# Patient Record
Sex: Female | Born: 1970 | Race: White | Hispanic: No
Health system: Midwestern US, Academic
[De-identification: ages and names within clinical notes are randomized; demographics above are authoritative.]

---

## 2013-11-01 IMAGING — CR PELVIS
5 series · 5 of 5 positions shown · non-contrast
Comparison: none

EXAM:  FIVE VIEWS LUMBAR SPINE
HISTORY: Low back pain.  Right radiculopathy.

[l-spine ap]
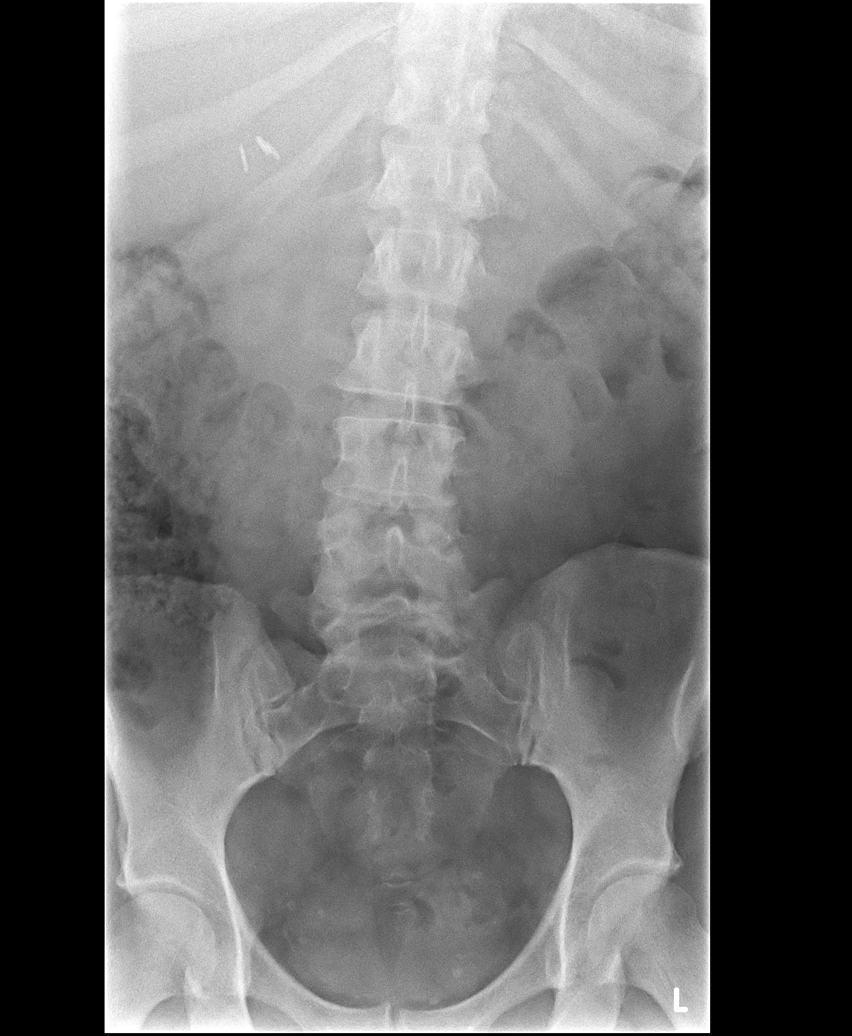

[l-spine obl (1 of 2)]
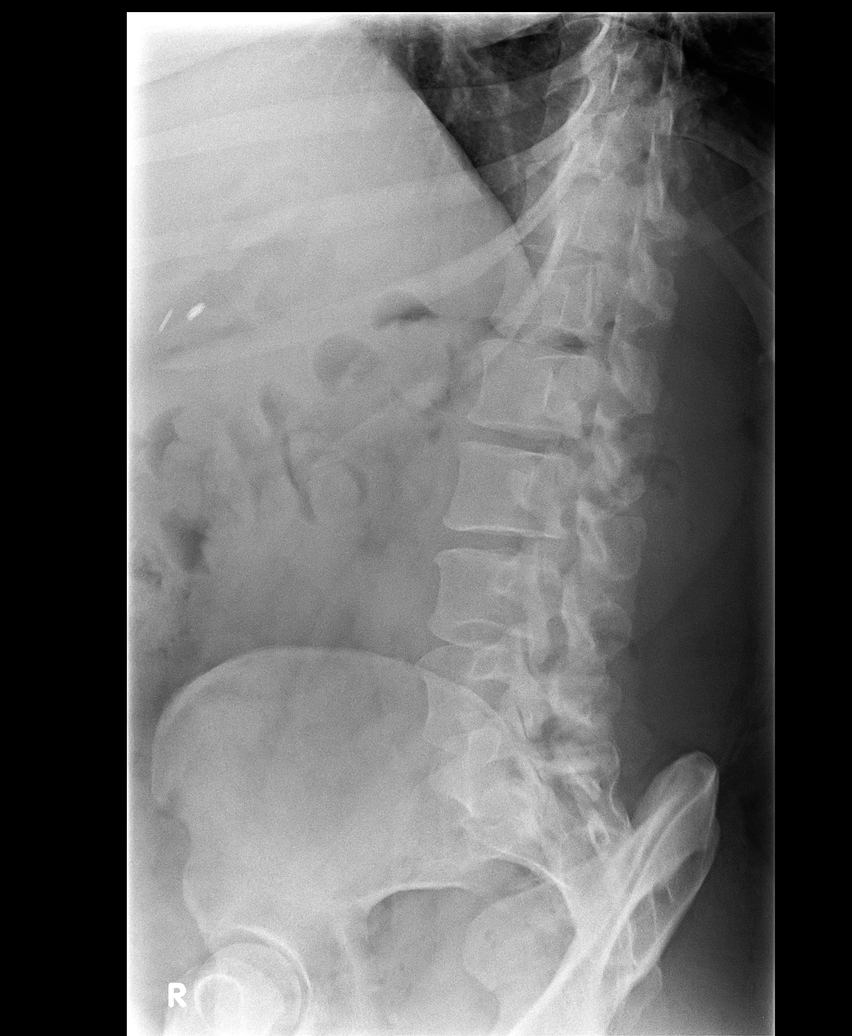

[l-spine obl (2 of 2)]
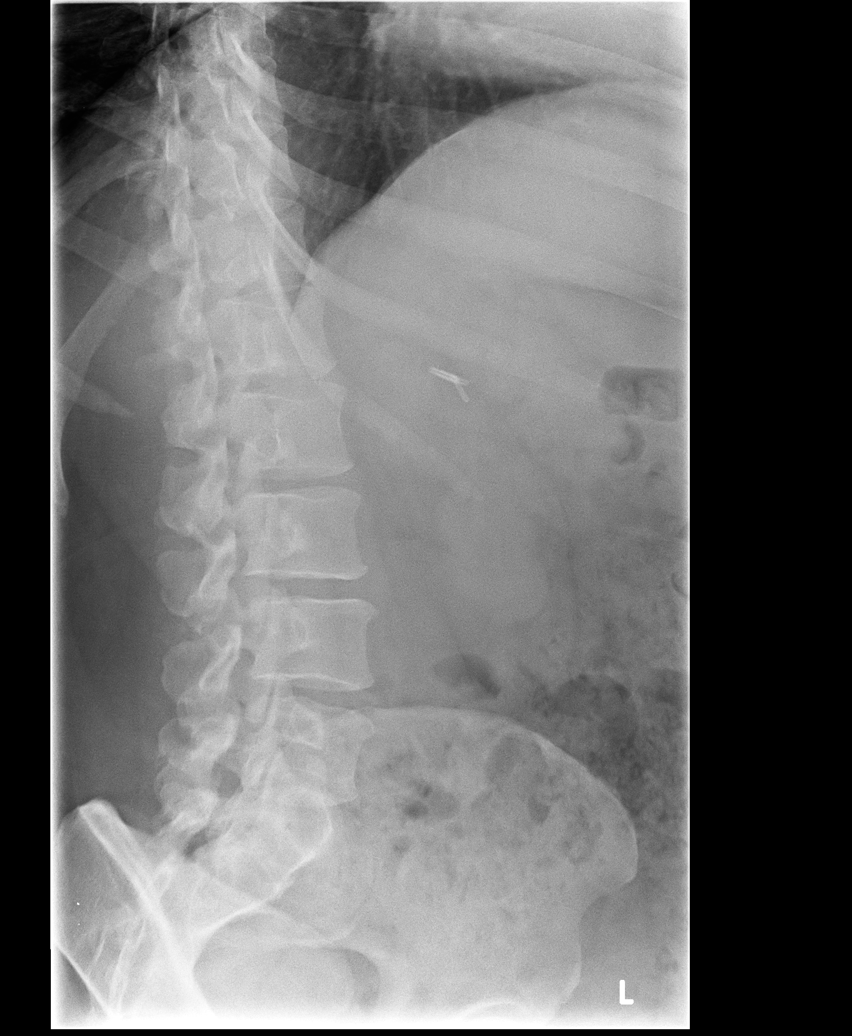

[l-spine lat]
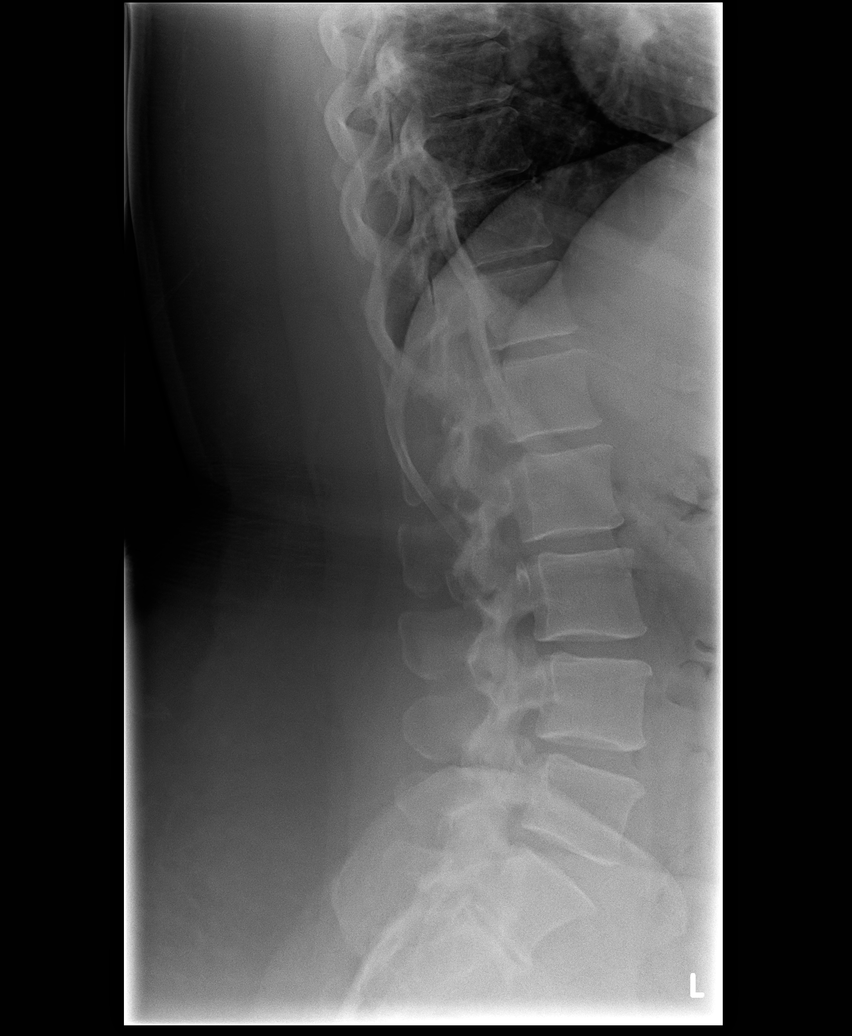

[l-spine l5-s1]
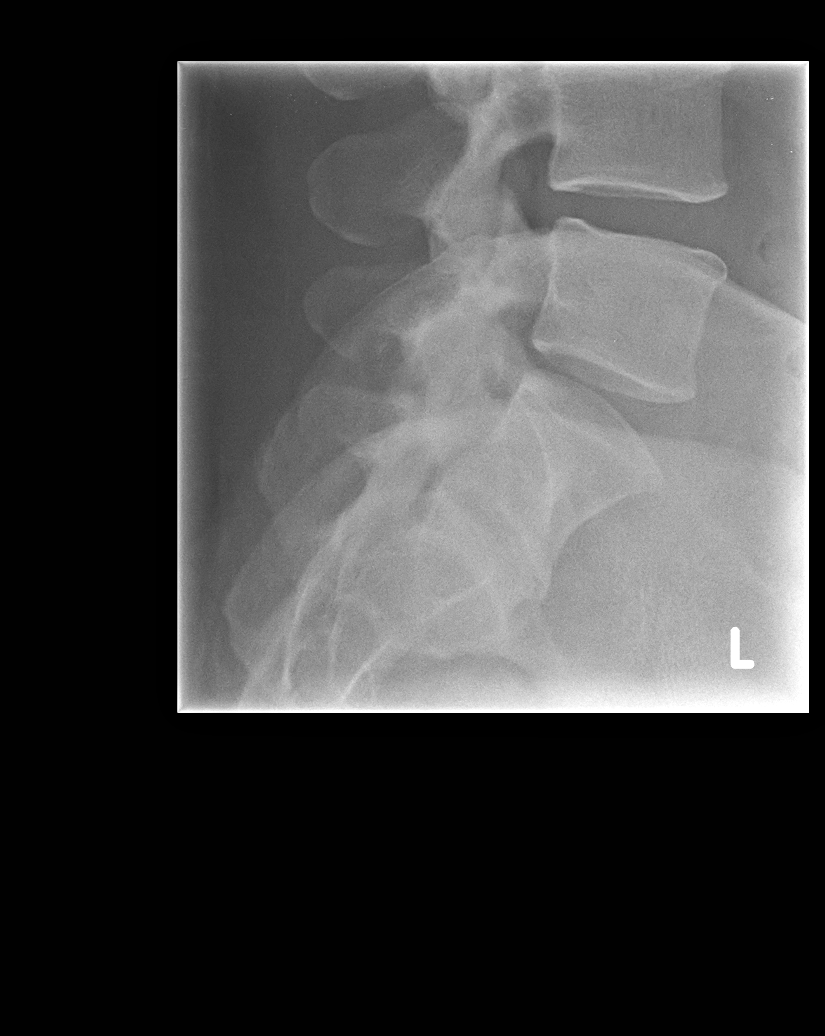

[5 of 5 positions shown; findings below may reference images not displayed]

IMPRESSION: Degenerative facet changes.  These appear advanced for age and not
particularly subtle for evident habitus.
FINDINGS: Five views of the lumbar spine show good preservation of vertebral body
heights.  Intervertebral disc heights appear well preserved.
Degenerative facet changes noted at 2-3, 3-4, 4-5 and 5-1.
Mild narrowing of the 5-1 foramen secondary to facet hypertrophy.

Tech Notes: LOW BACK PAIN, RADIATION DOWN RT LEG. RG

## 2013-11-04 IMAGING — CR PELVIS
3 series · 3 of 3 positions shown · non-contrast
Comparison: None

EXAM: Bilateral hips
INDICATION: Right hip pain
TECHNIQUE: AP pelvis, lateral left, lateral right hip

[pelvis]
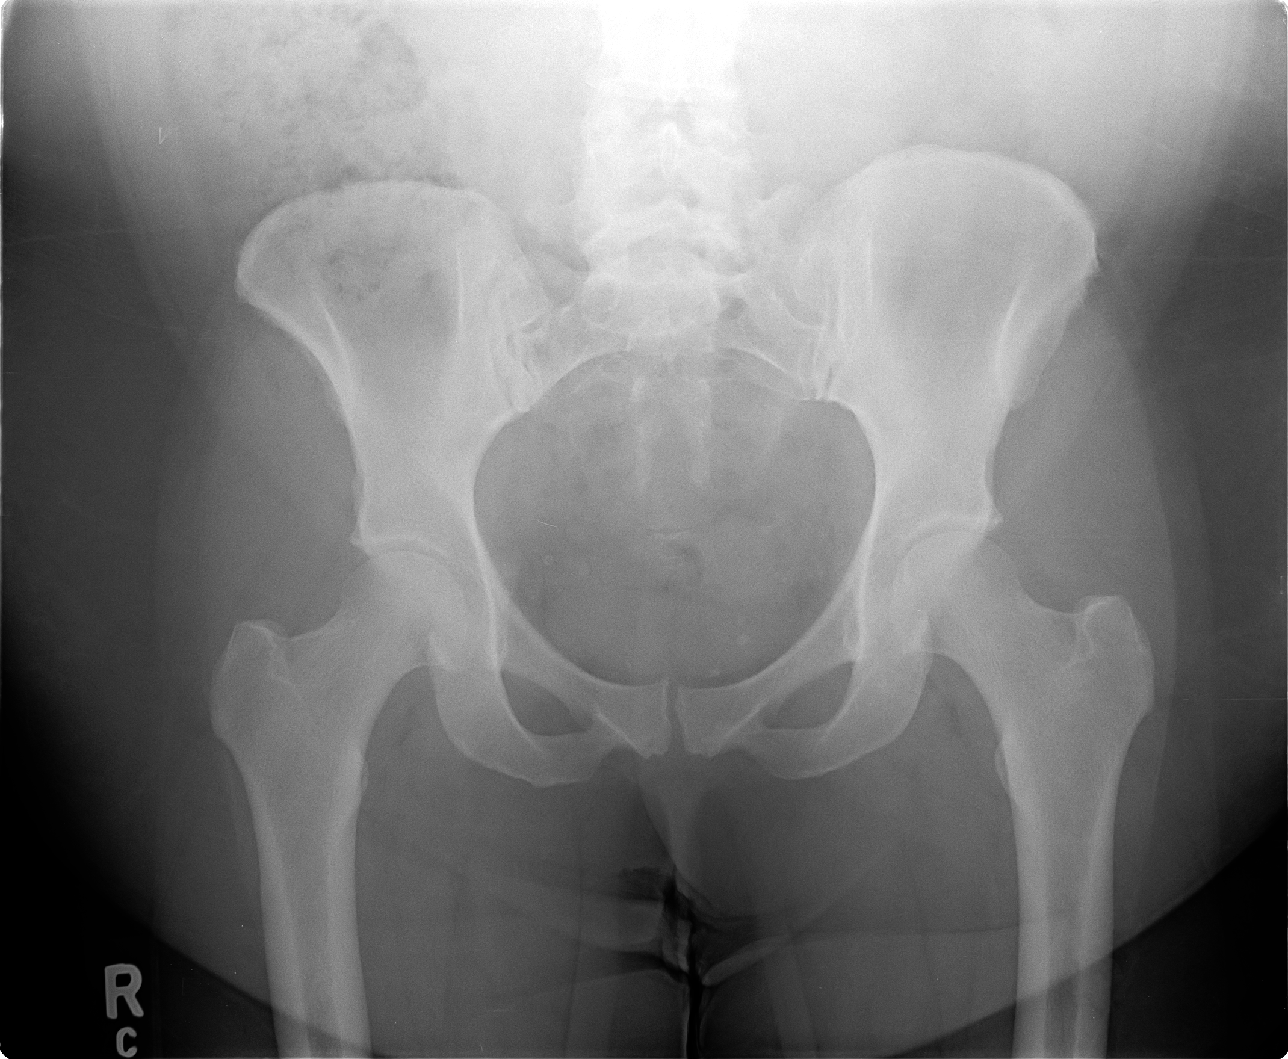

[hip frog (1 of 2)]
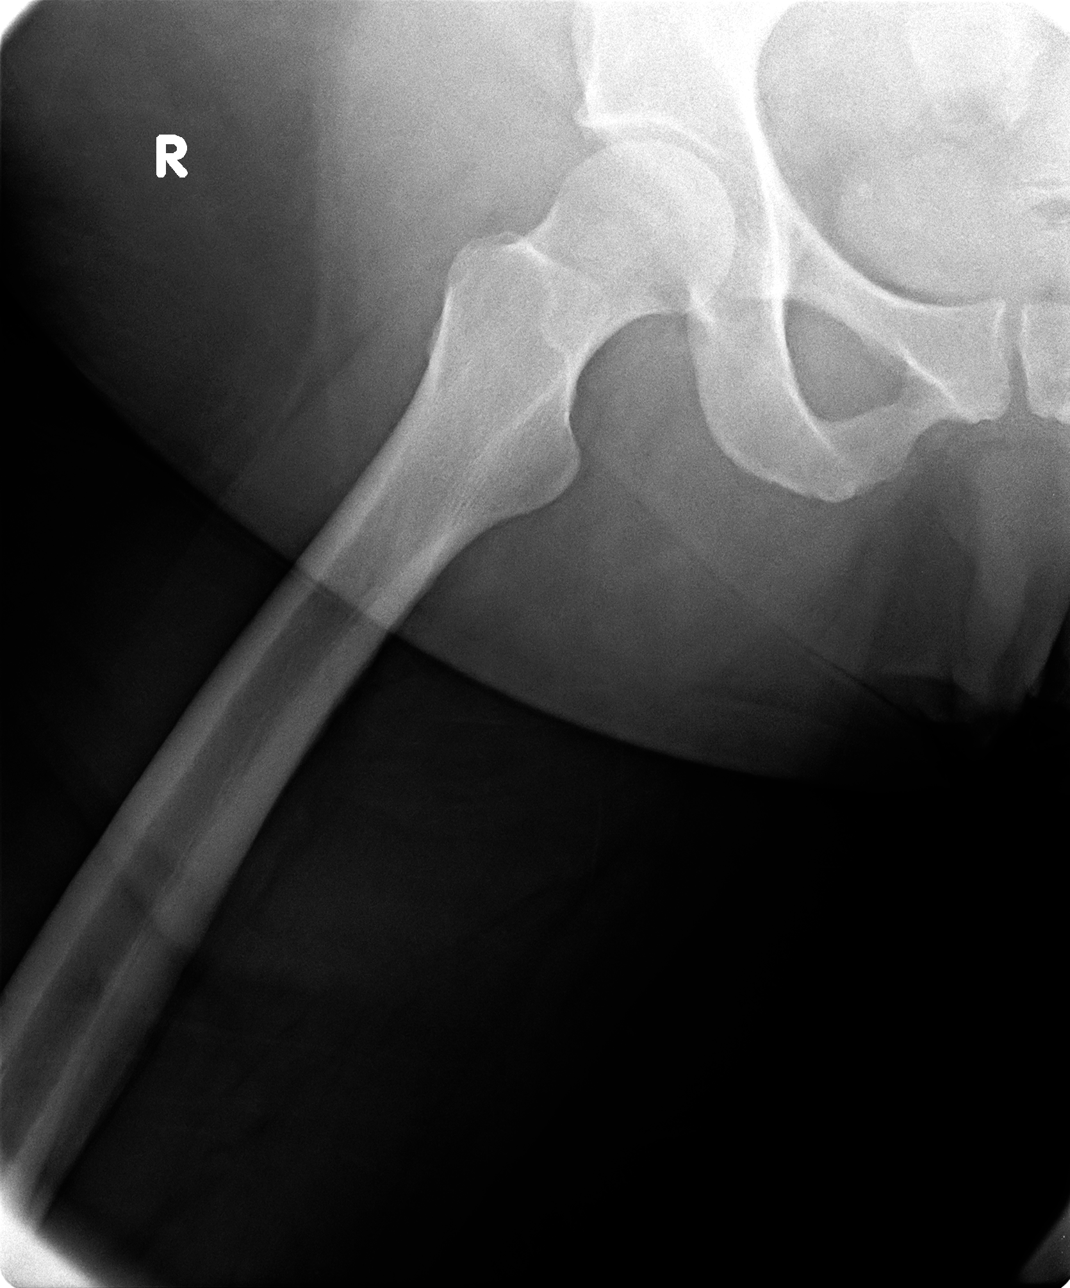

[hip frog (2 of 2)]
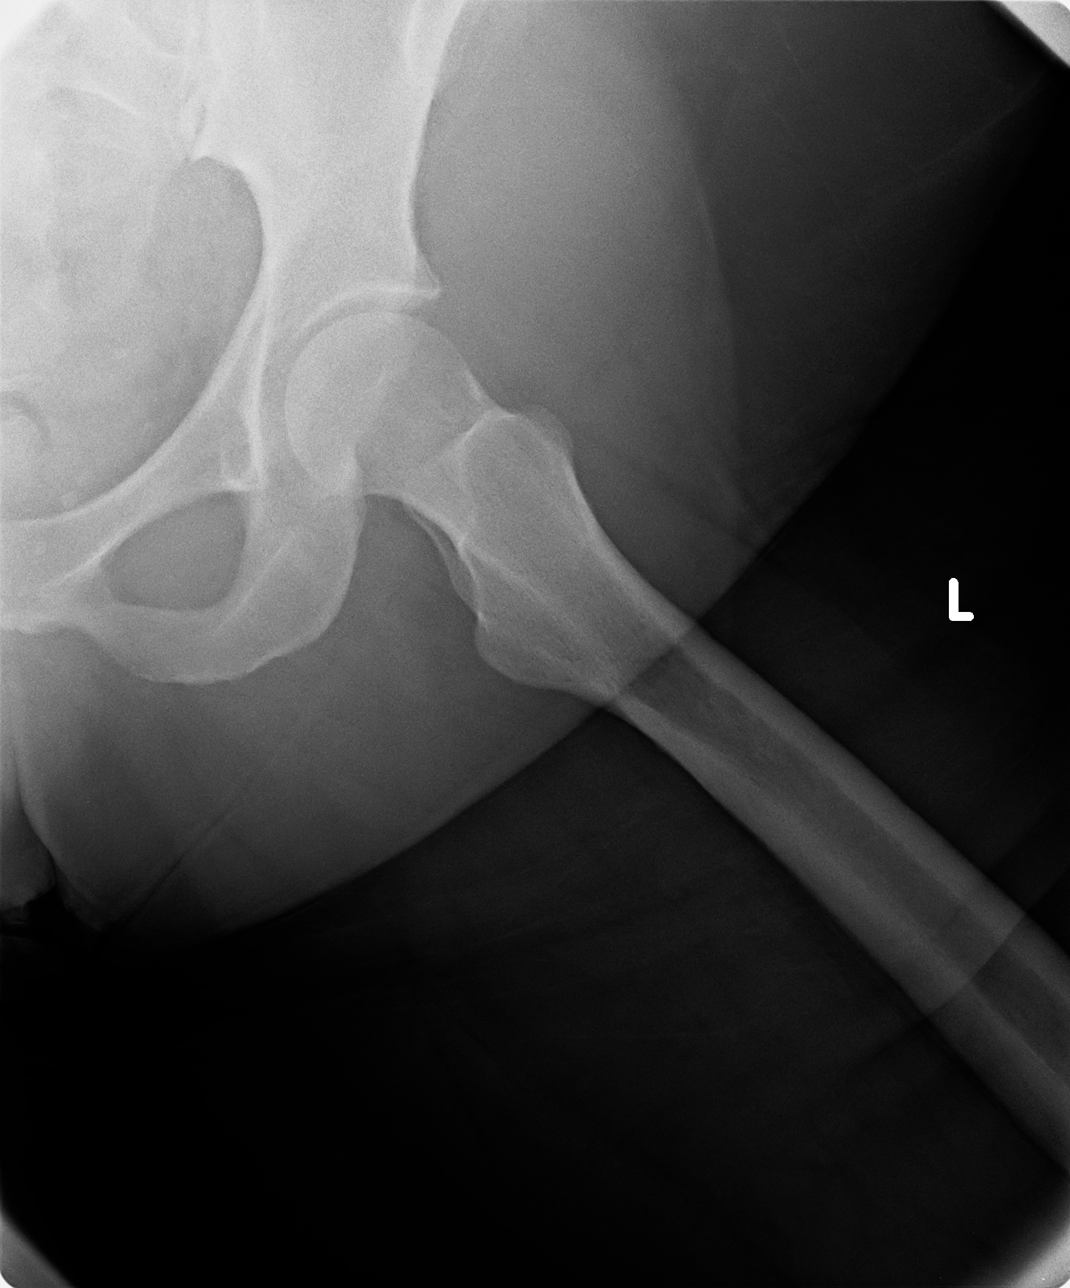

[3 of 3 positions shown; findings below may reference images not displayed]

IMPRESSION: Mild degenerative change of the bilateral hips without acute bony abnormality.
FINDINGS: There is normal rounded contour is of the femoral heads.  Mild degenerative
change within the acetabulum.  No fracture, no localized bony lesions.  Soft
tissues unremarkable.

Dictated by Sanford, Mbekou
Preliminary report until reviewed and verified by Alder, Eu

Tech Notes: PAIN. NO INJURY.MIKAILA

## 2014-11-08 IMAGING — CR LOW_EXM
2 series · 2 of 2 positions shown · non-contrast
Comparison: None available

EXAM: Left foot
INDICATION: Pain first metatarsal
TECHNIQUE: AP and lateral projections.

[foot]
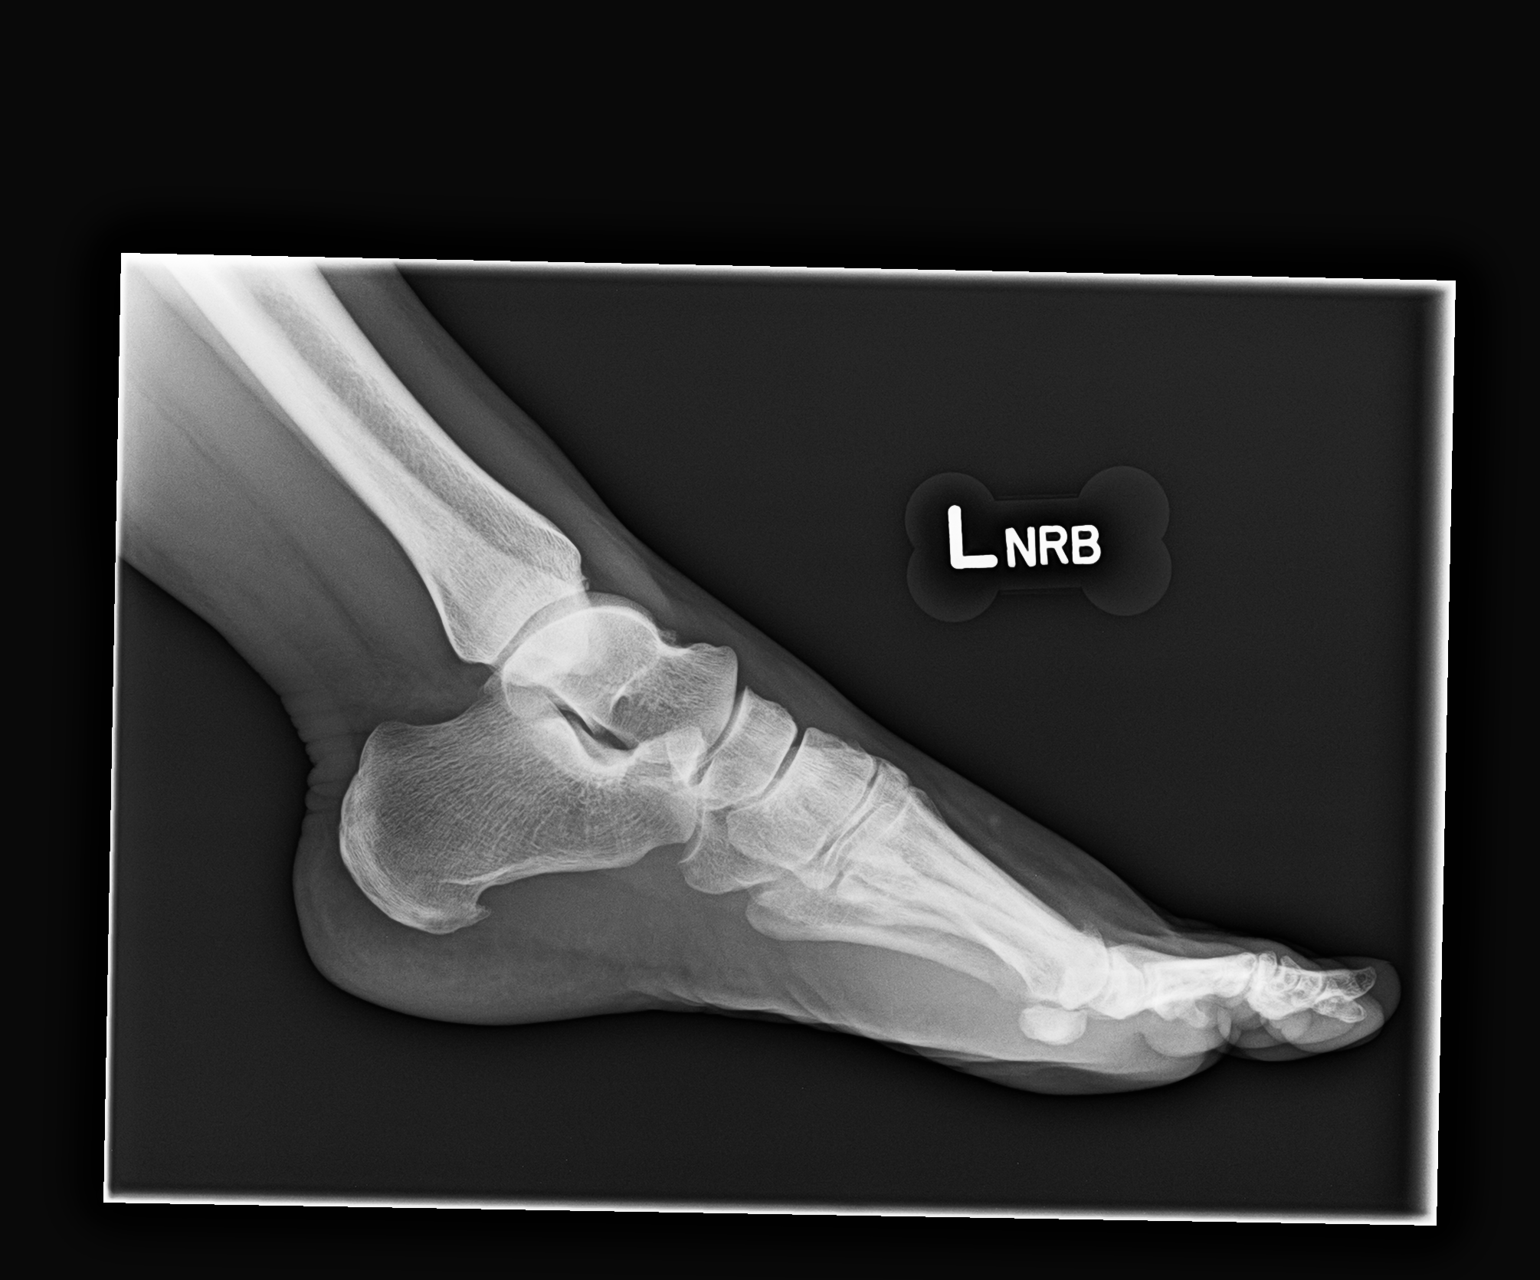

[foot lat]
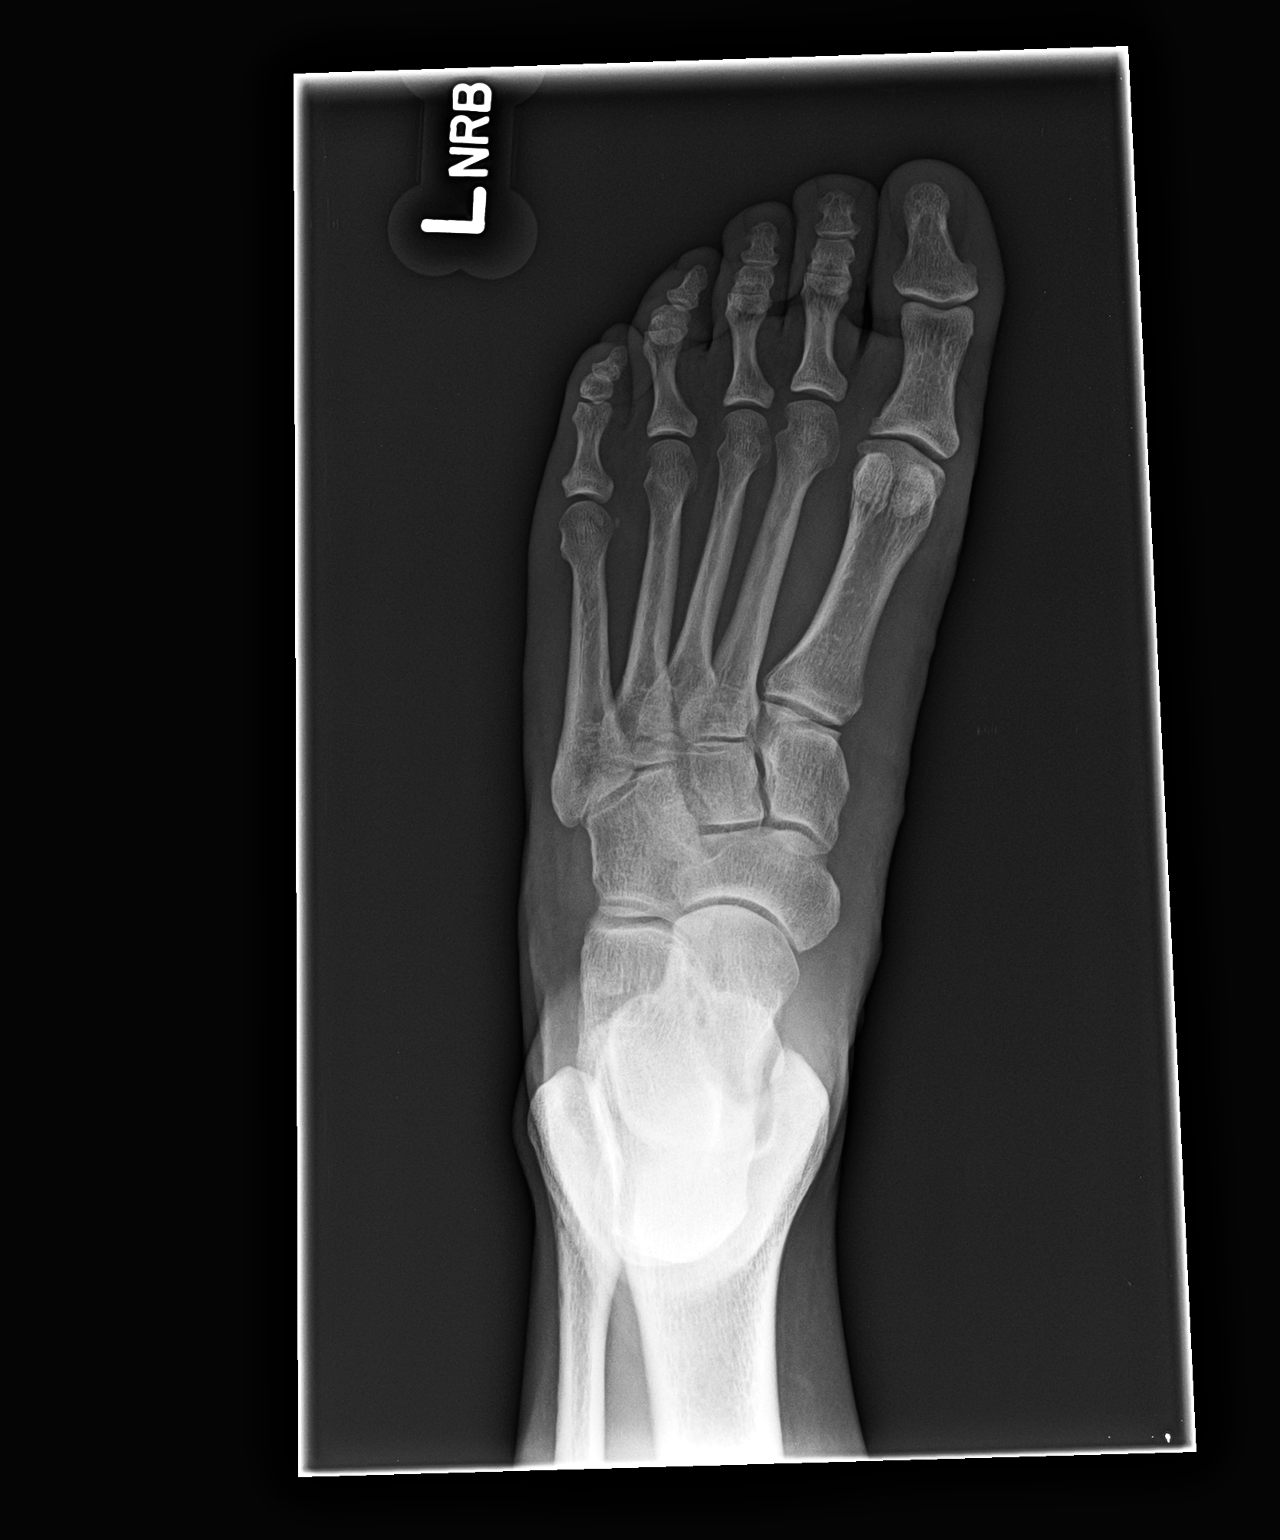

[2 of 2 positions shown; findings below may reference images not displayed]

IMPRESSION: Mild degenerative changes.
No acute bony process.
If there are continued complaints serial clinical and as indicated imaging
follow up is recommended
FINDINGS: There is no fracture, dislocation or destructive process of bone.
There is a small plantar spur
There are mild intertarsal degenerative changes.
There is mild joint space narrowing and spurring of the lateral aspect of the
first metatarsal-phalangeal joint.
There is slight soft tissue thickening along the extensor aspect of the
distal foot

Tech Notes: NO KNOWN INJURY. PAIN STARTED A COUPLE WEEKS AGO ON LEFT 1ST METATARSAL.

## 2014-12-03 IMAGING — US US RETROPERITONEAL LIMITED
1 series · 14 of 25 positions shown · non-contrast
Comparison: none

ULTRASOUND REPORT

US  RENAL
INDICATION: Hematuria
COMPARISION:CT abdomen pelvis November 06, 2013
TECHNIQUE: Real time transabdominal grayscale images plus color duplex.

[Series 1: us retroperitoneal limited · 14 of 34 slices shown]
[im 1/34]
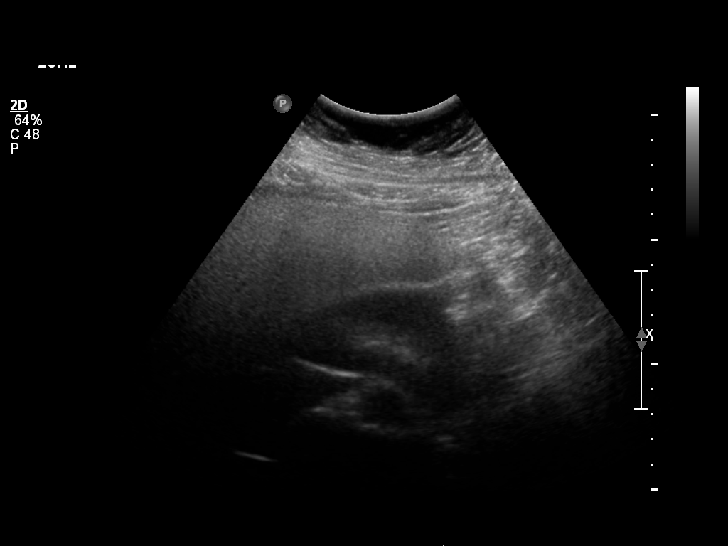
[im 3/34]
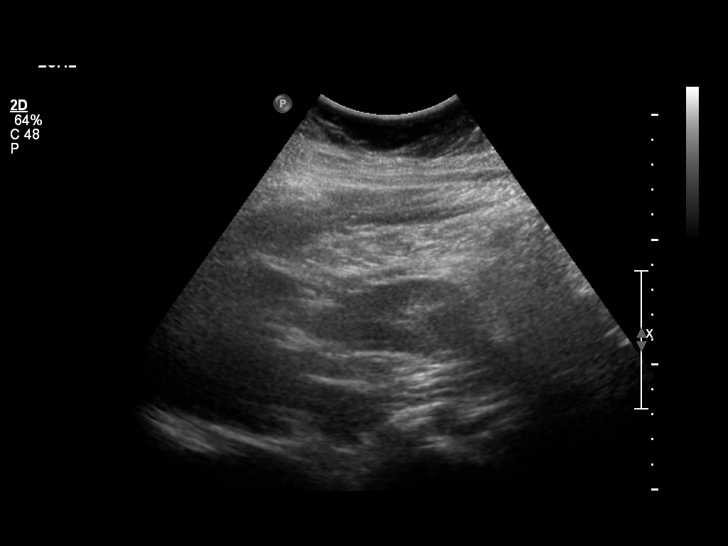
[im 6/34]
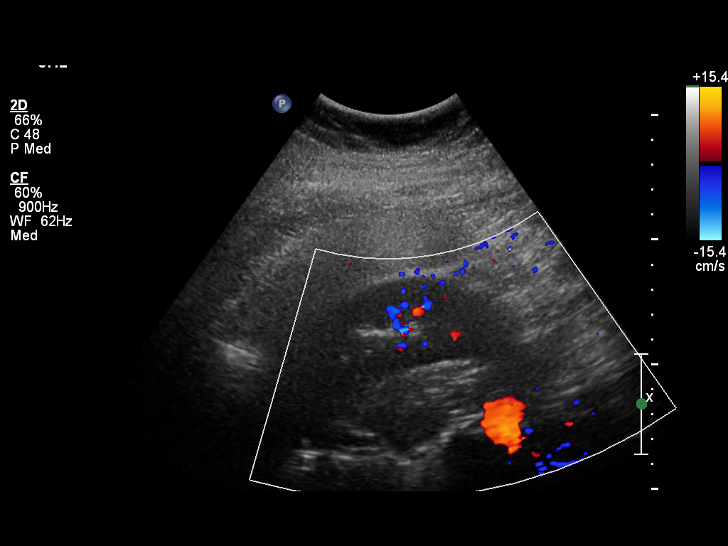
[im 9/34]
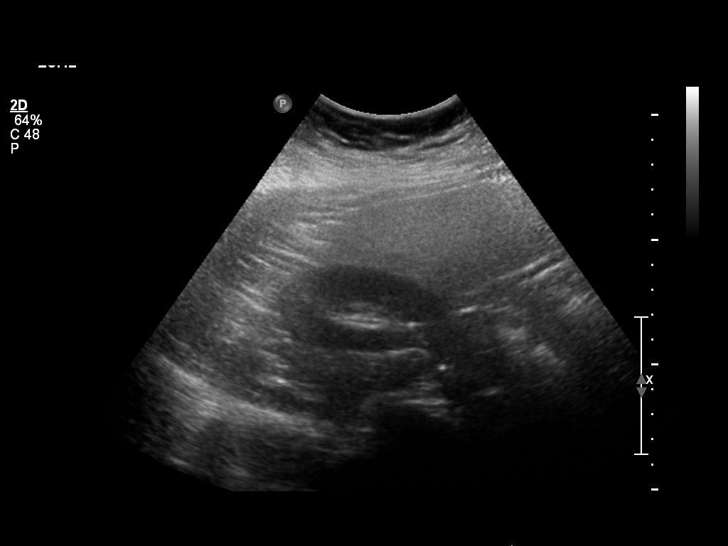
[im 12/34]
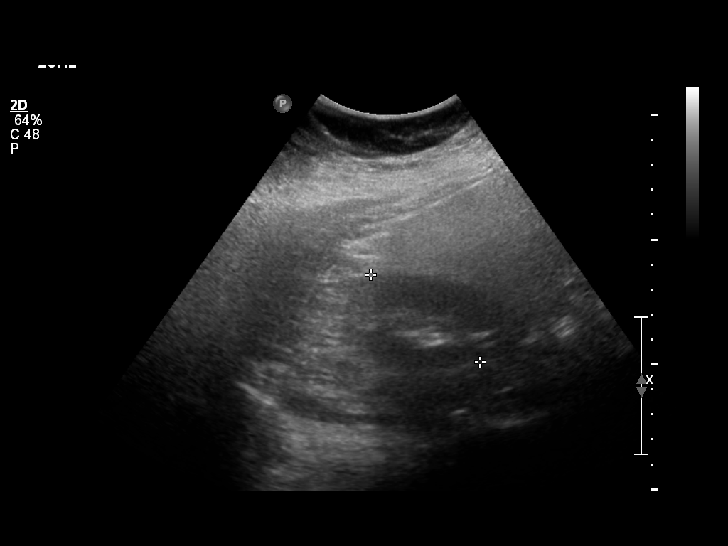
[im 13/34]
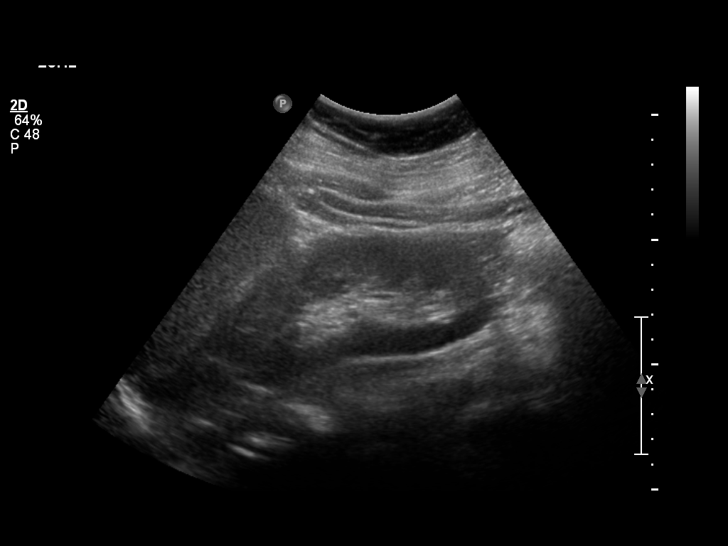
[im 16/34]
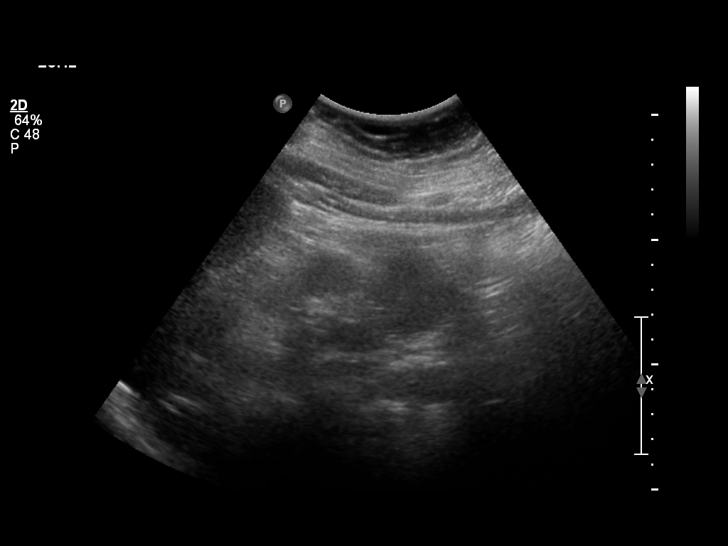
[im 18/34]
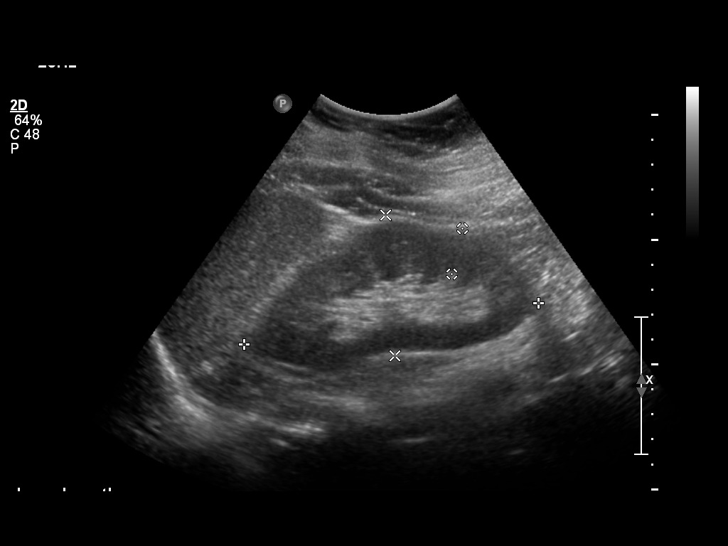
[im 21/34]
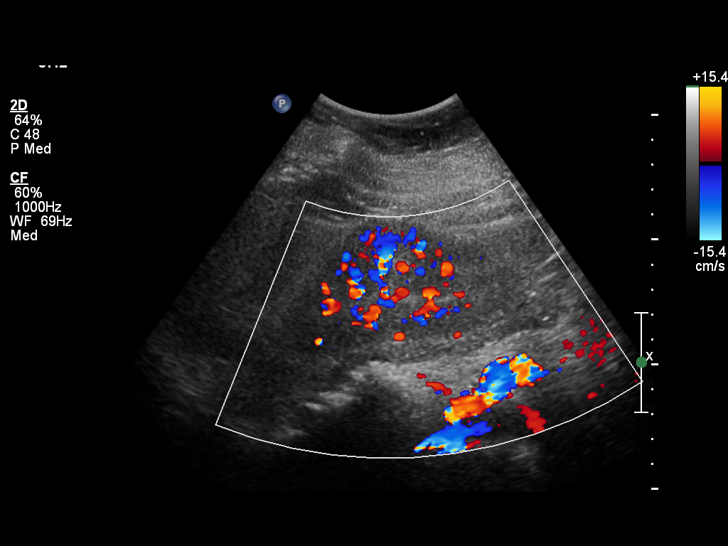
[im 23/34]
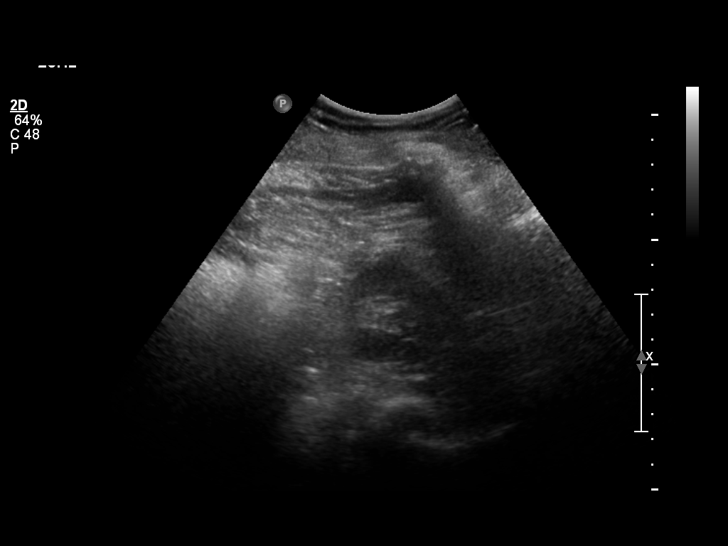
[im 25/34]
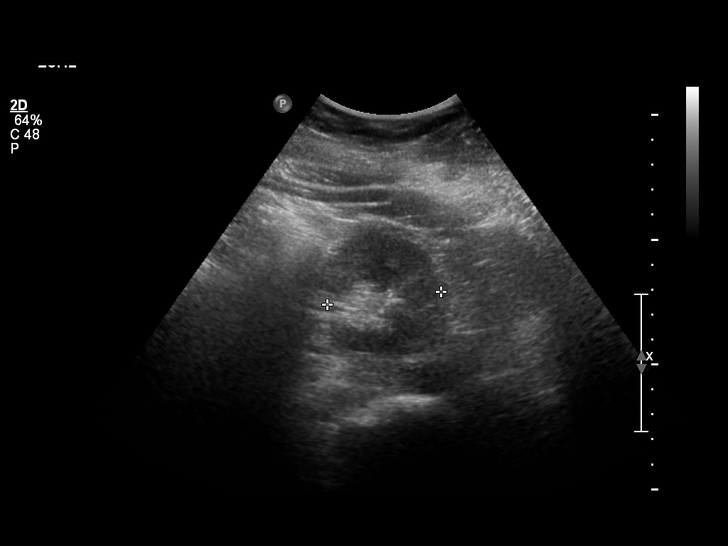
[im 28/34]
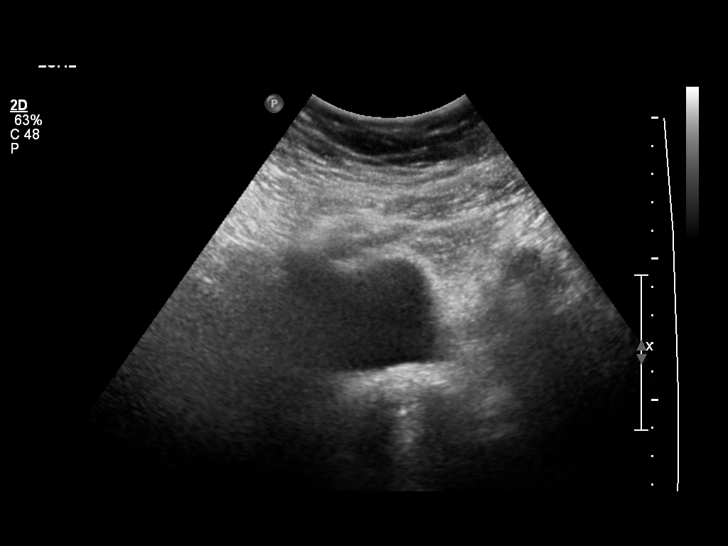
[im 31/34]
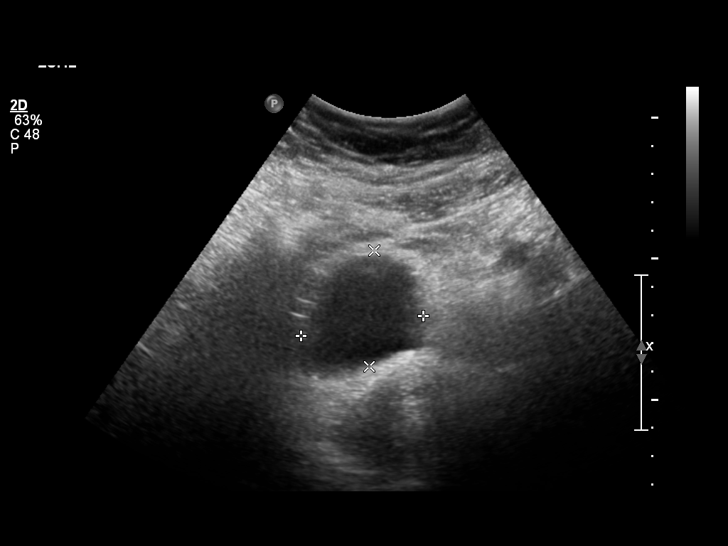
[im 34/34]
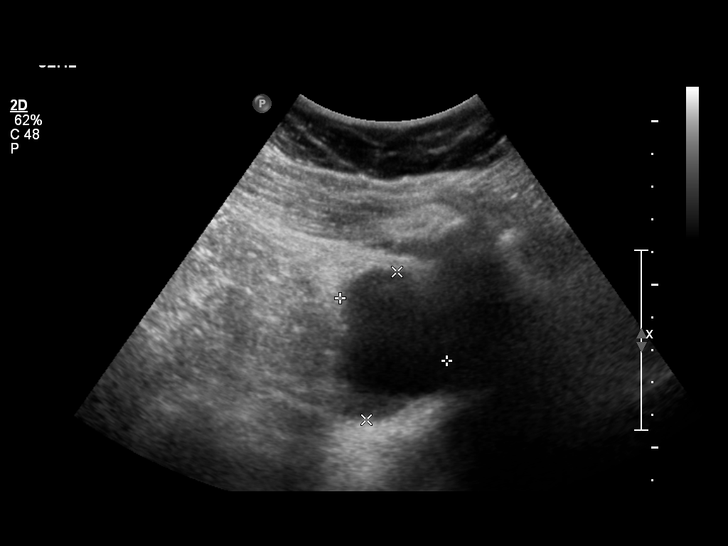

[14 of 25 positions shown; findings below may reference images not displayed]

IMPRESSION: Reduced size of the right kidney
Otherwise physiologic appearance of the kidneys.
FINDINGS: The right kidney measures 86.8 x 56 x 41.6 mm in size.
There is no hydronephrosis
There is no  focal abnormality
There cortical thickness is 12 mm
The cortex is hypoechoic to the liver
There is satisfactory color arborization.

The left kidney measures 118.8 x 56.4 x 45.7  mm in size.
There is no hydronephrosis.
There is no  focal abnormality
There cortical thickness is  mm
The cortex is hypoechoic to the liver
There is satisfactory color arborization

The bladder volume is 39.9 ml.
Bilateral urine jets are seen

## 2015-06-24 IMAGING — CR LOW_EXM
2 series · 2 of 2 positions shown · non-contrast
Comparison: none

[foot]
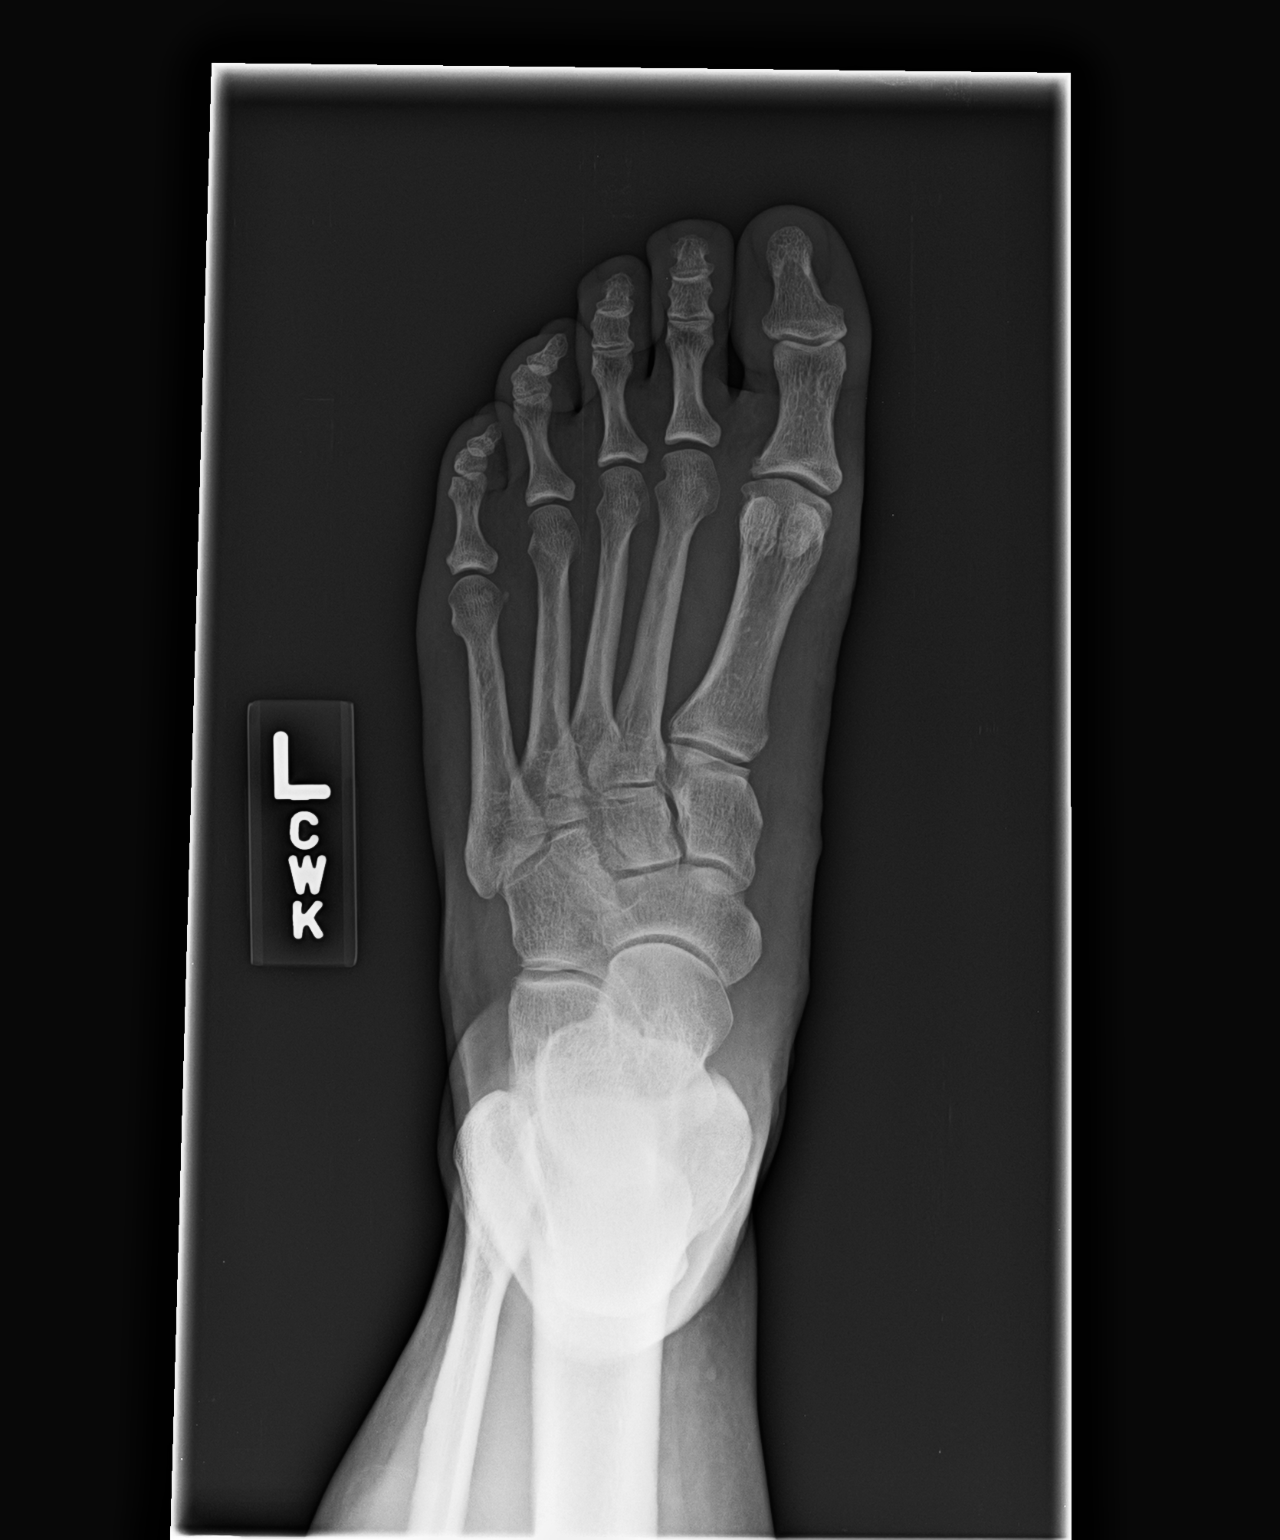

[foot lat]
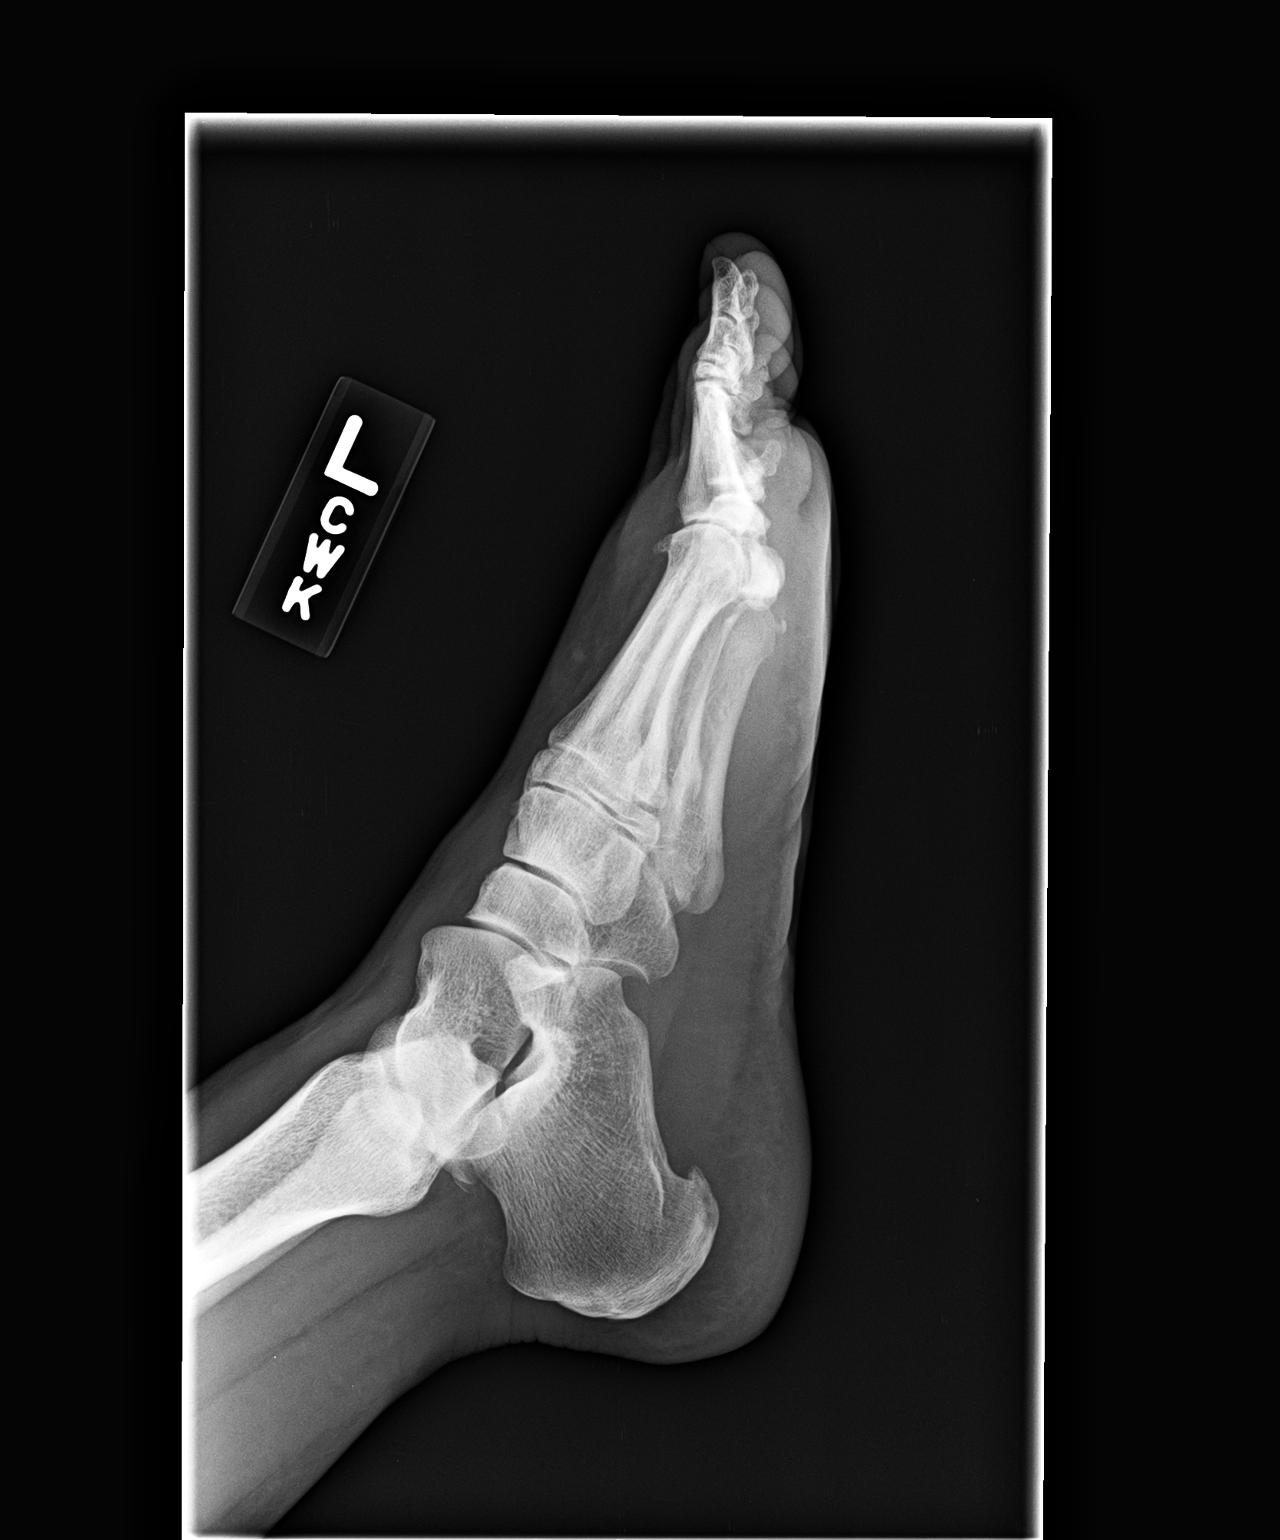

[2 of 2 positions shown; findings below may reference images not displayed]

EXAM
Two views left foot]

INDICATION
Left 1st MP joint pain/edema
PT. C/O PAIN AT FIRST MTP JOINT. ON THE DORSAL SIDE OF FOOT. CK

TECHNIQUE
AP, lateral, views were obtained

COMPARISONS
None available.

FINDINGS
There is a small heel spur. There is mild metatarsal-phalangeal joint degenerative change of the
1st digit with minimal hammertoe deformities of the 4th and 5th digits. There is no acute fracture,
dislocation, or other acute osseus abnormality. The soft tissues are unremarkable.

IMPRESSION
1. [Mild 1st MTP degenerative change with loss of joint space and marginal osteophyte formation.
No acute osseous abnormality.

## 2015-07-14 IMAGING — CR LOW_EXM
3 series · 3 of 3 positions shown · non-contrast
Comparison: none

[foot]
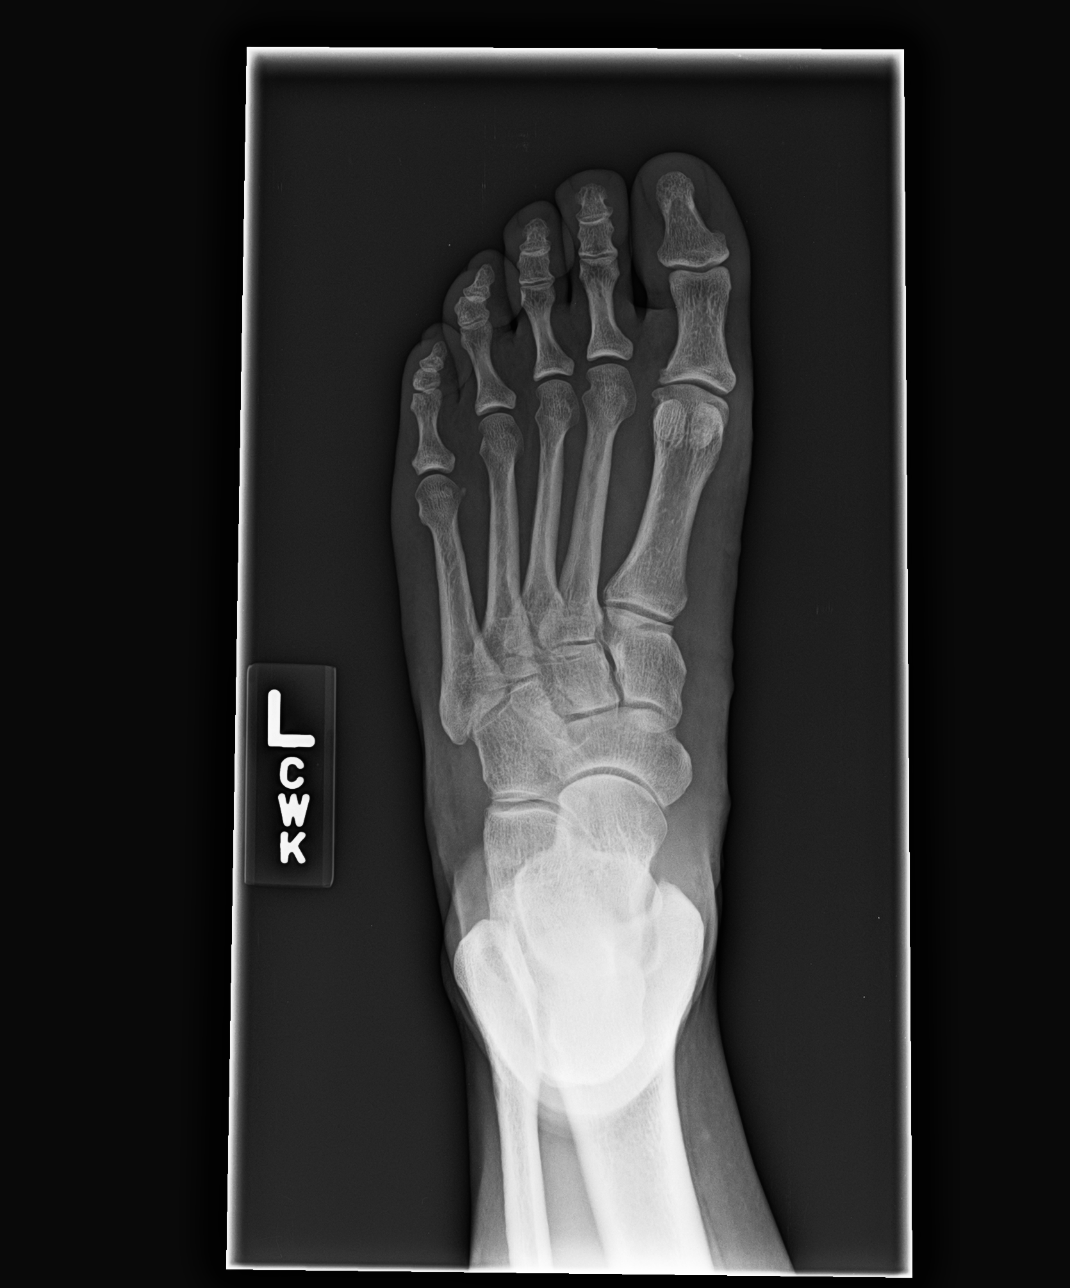

[foot obl]
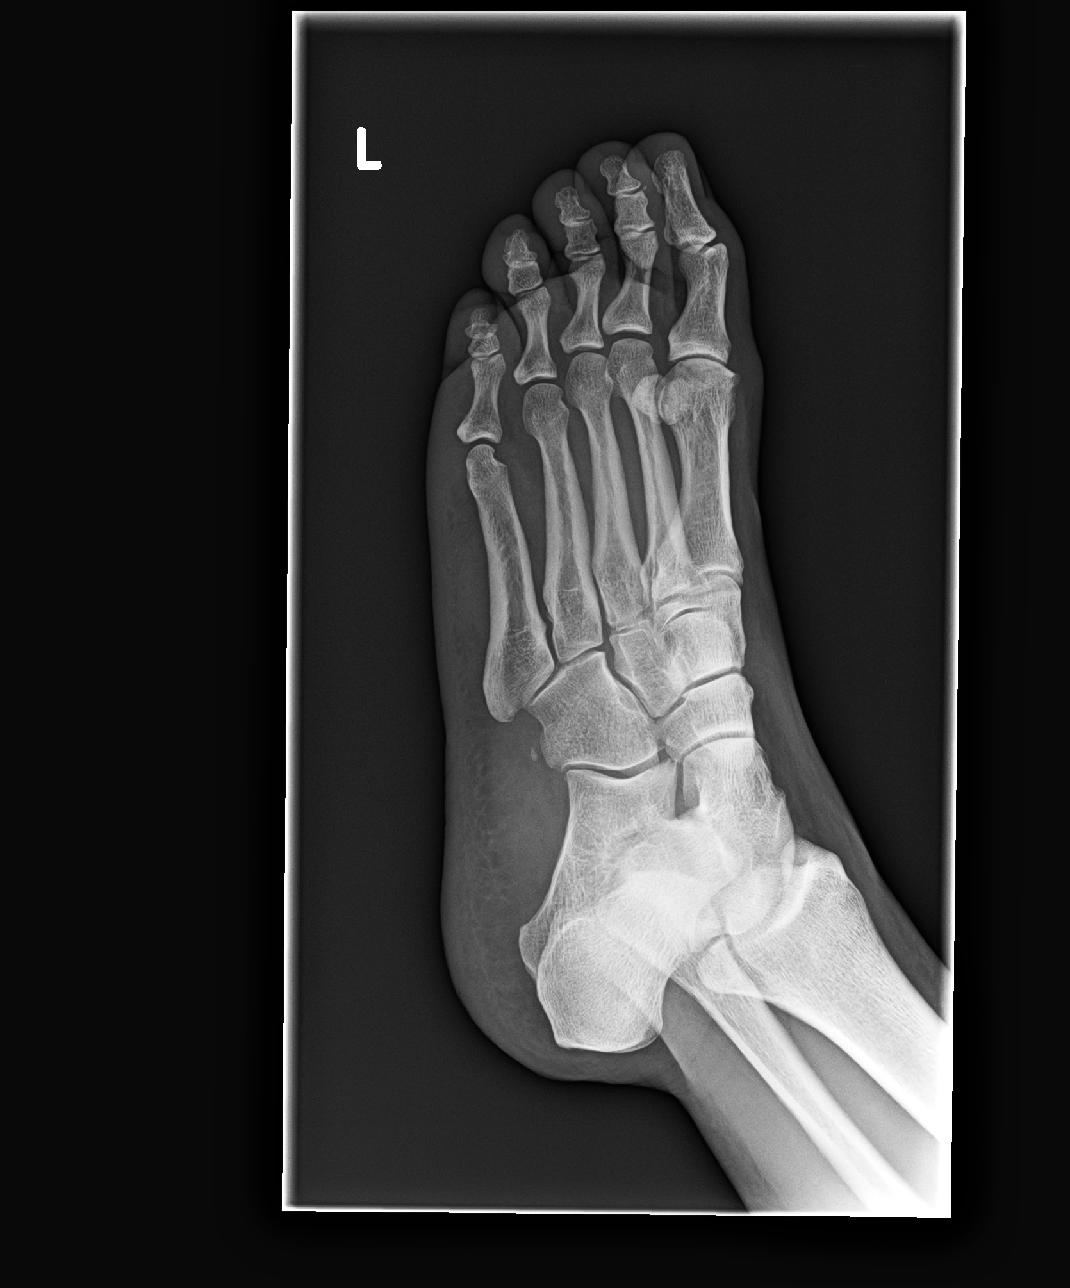

[foot lat]
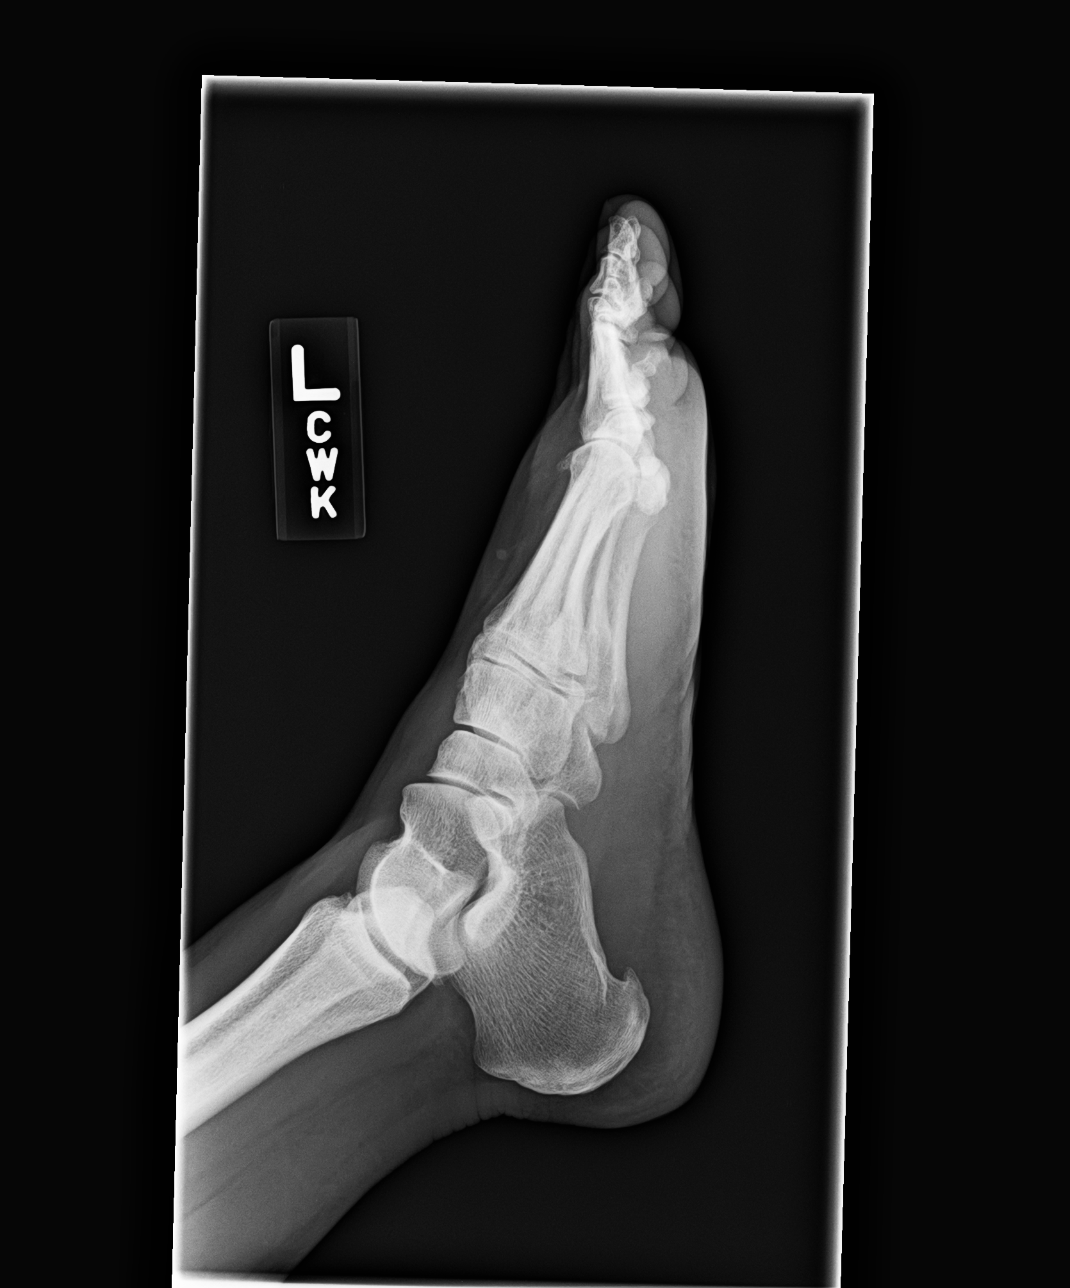

[3 of 3 positions shown; findings below may reference images not displayed]

EXAM
RADIOLOGICAL EXAMINATION, FOOT; COMPLETE
3 OR MORE VIEWS CPT 95053

INDICATION
Acute Pain MTP left great two. Min Sweling
PT. C/O SHARP PAIN WHEN WEIGHT BEARING AND WHEN AT REST ACROSS THE
METATARSALS, INTO ALL MTP JOINTS. CK

TECHNIQUE
3 views of theleftfoot were acquired.

COMPARISONS
Previous examination dated 06/24/2015.

FINDINGS
There are no fractures or subluxations of the foot. There are no abnormal masses or
calcifications. There are no blastic or lytic lesions. Degenerative changes of the 1st
metatarsophalangeal joint are redemonstrated. There is joint space narrowing laterally. Small
plantar calcaneal spur is noted.

IMPRESSION
Plantar calcaneal spur. Mild degenerative changes of the 1st metatarsophalangeal joint. No
significant interval changes when compared with previous examination.

## 2015-08-30 IMAGING — CR LOW_EXM
3 series · 3 of 3 positions shown · non-contrast
Comparison: none

[foot]
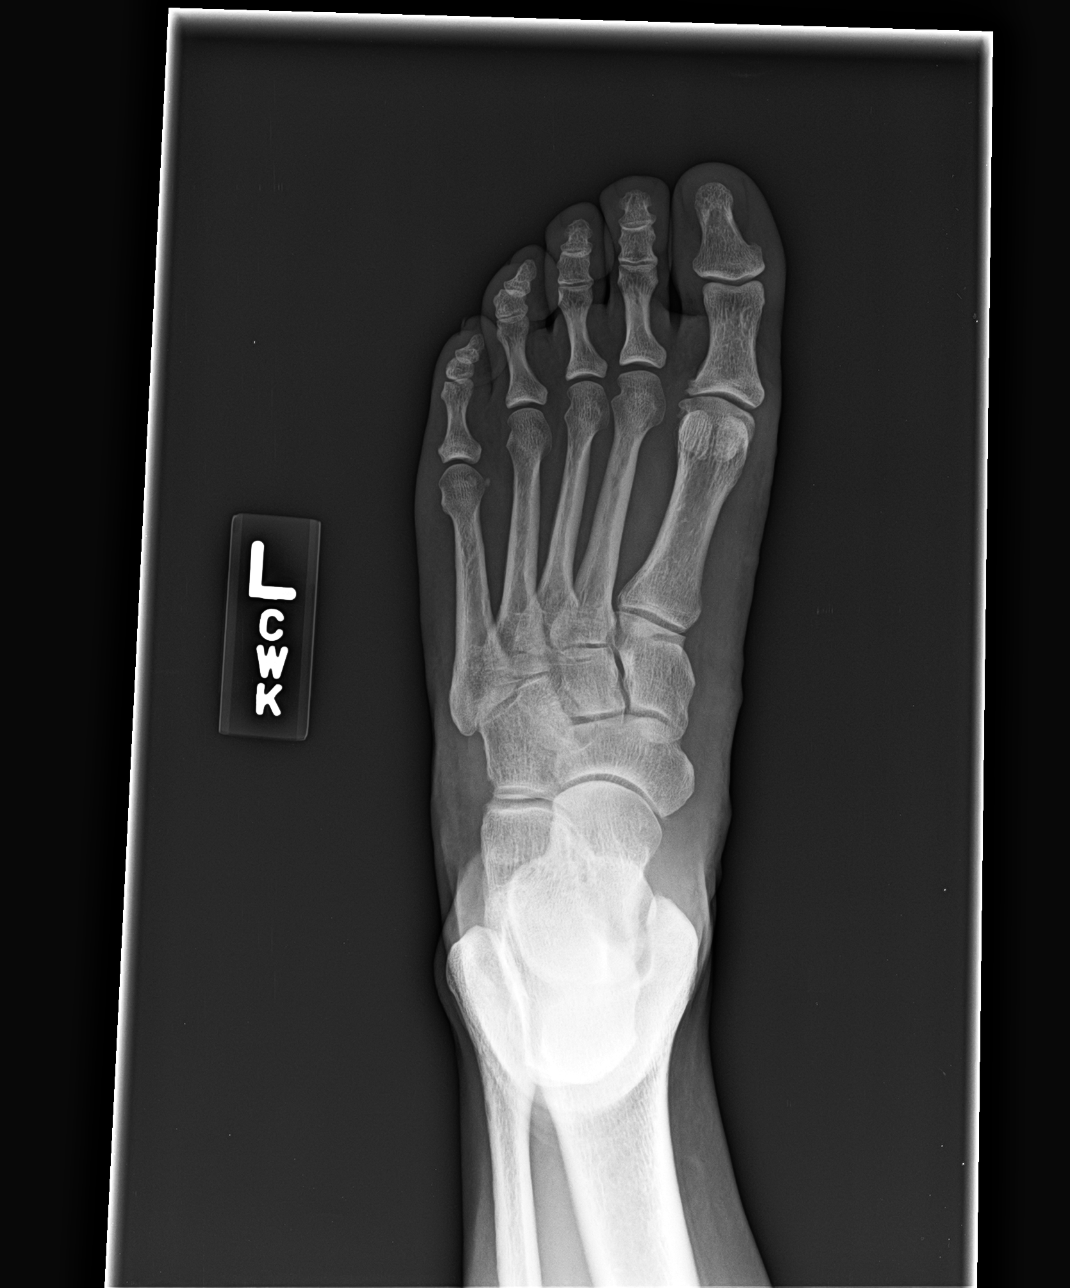

[foot obl]
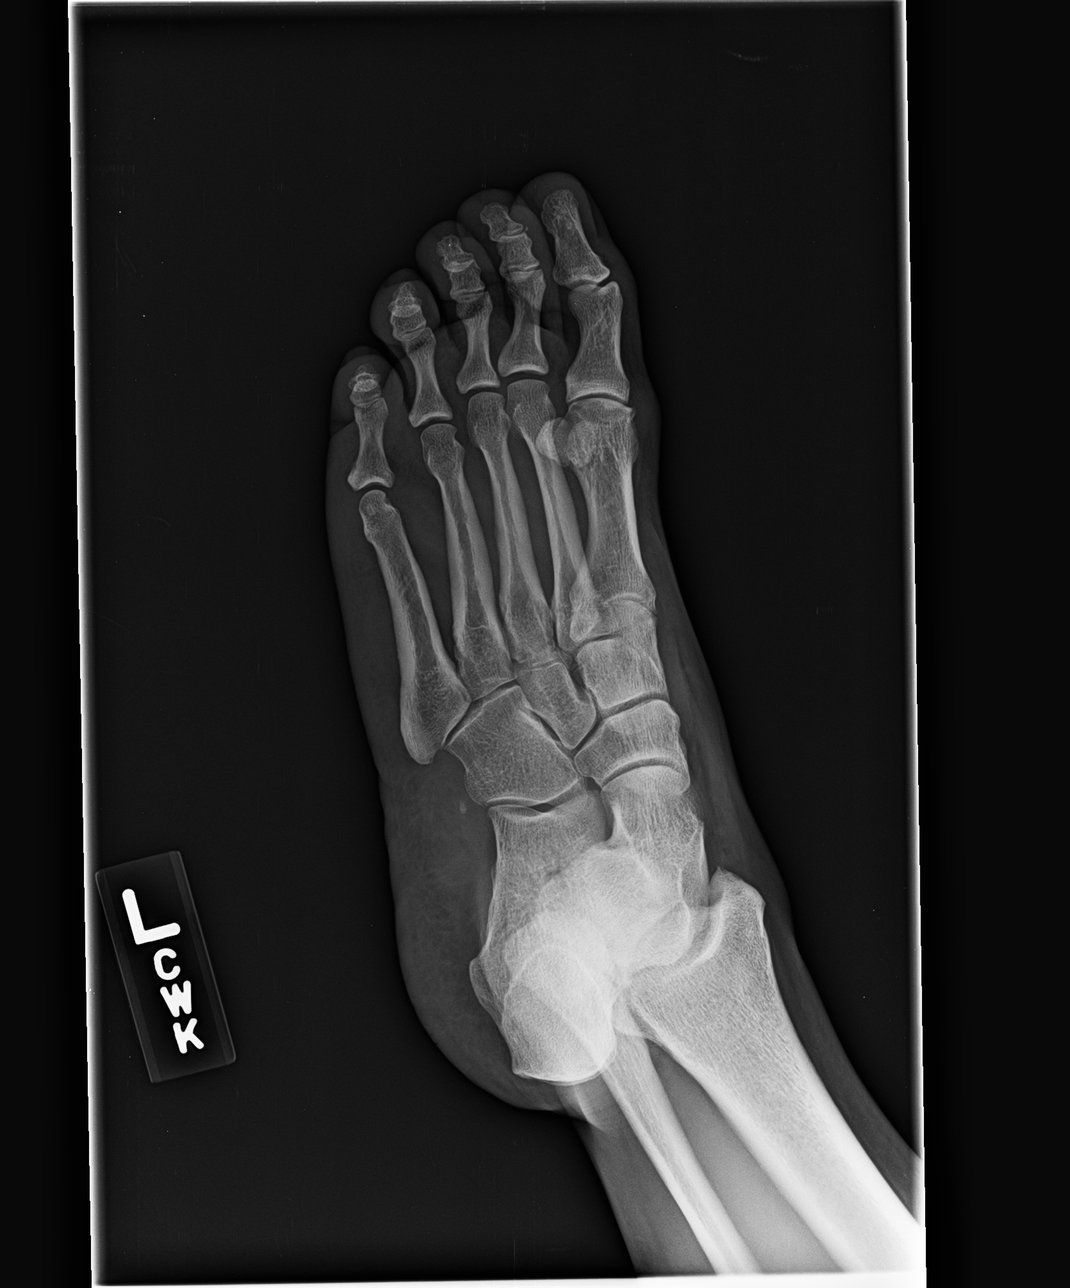

[foot lat]
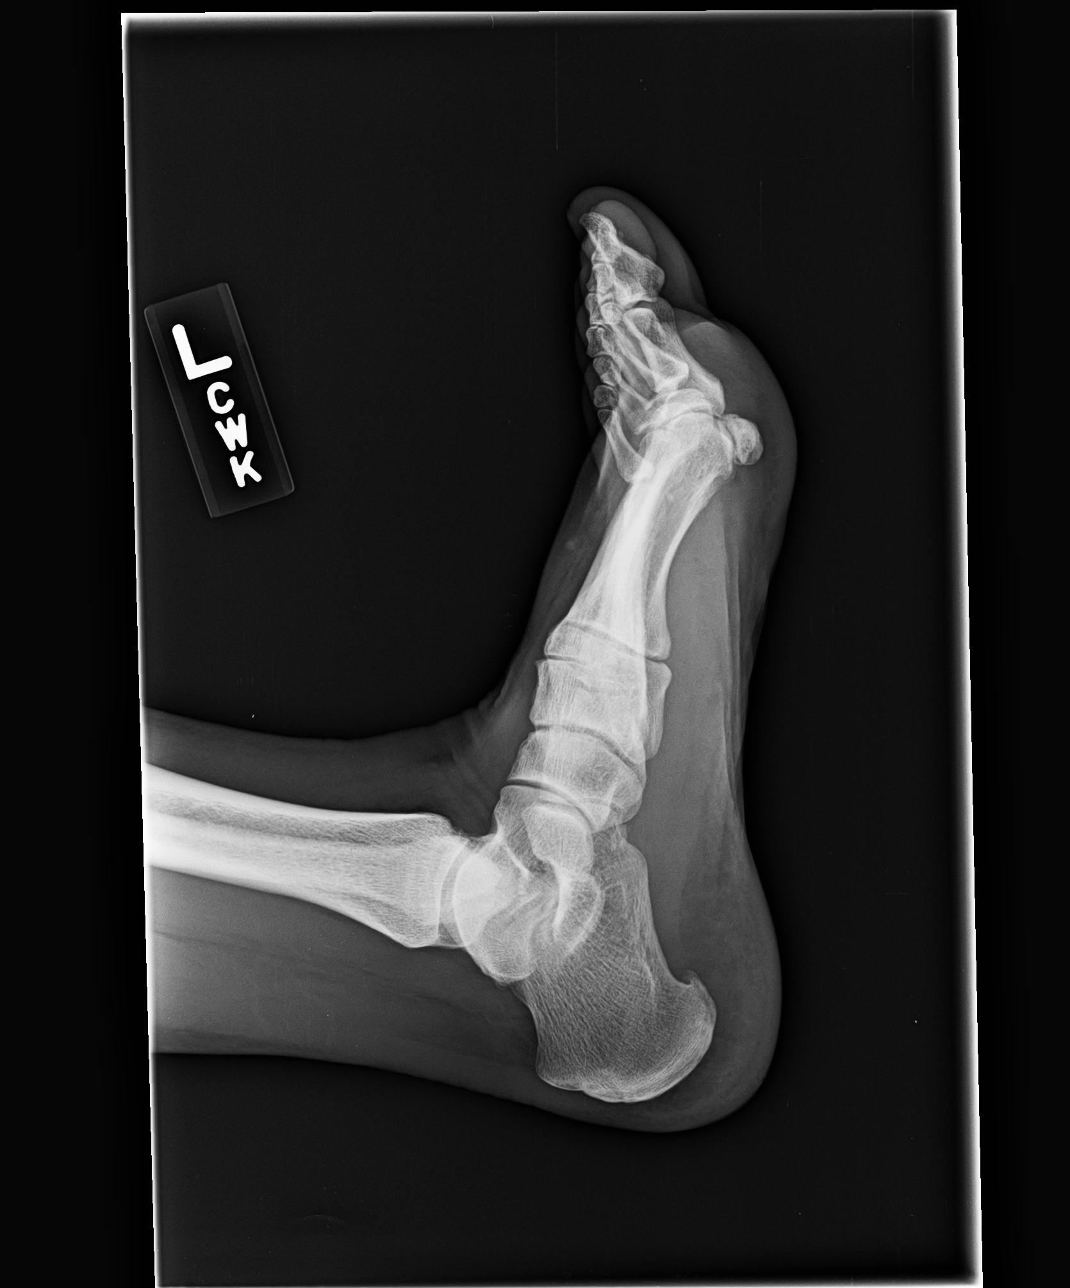

[3 of 3 positions shown; findings below may reference images not displayed]

DIAGNOSTIC STUDIES

EXAM
Left foot radiograph

INDICATION
left great toe pain
MEDIAL FOOT PAIN AFTER TWISTING INJURY. SHIELDED. CK

TECHNIQUE
AP, oblique, and lateral views of the left foot

COMPARISONS
Left foot radiograph 07/13/2005

FINDINGS
There is no acute fracture. Alignment is normal. Mild asymmetric joint space narrowing and
osteophyte formation at the great toe metatarsophalangeal joint is unchanged. No focal soft tissue
swelling.

IMPRESSION
[1. No acute osseous abnormality.
2. Unchanged mild degenerative joint disease of the great toe metatarsophalangeal joint.

## 2015-09-01 IMAGING — US [PERSON_NAME]
1 series · 13 of 13 positions shown · non-contrast
Comparison: none

[Series 1: us lower extrem left · 13 of 13 slices shown]
[im 1/13]
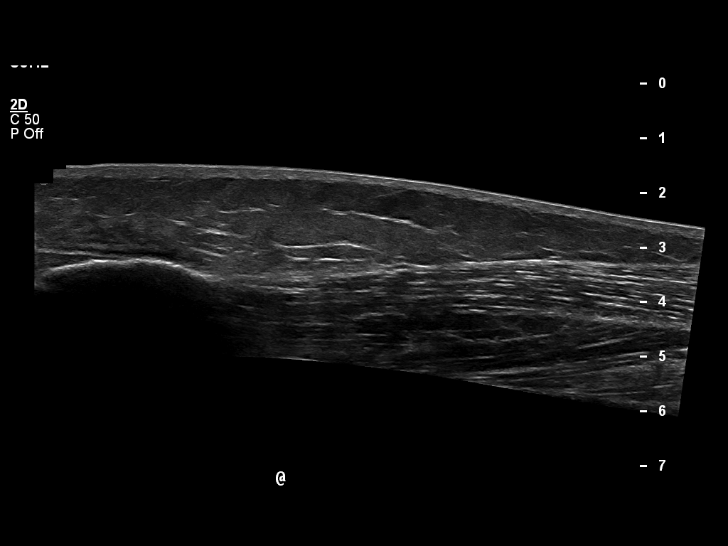
[im 2/13]
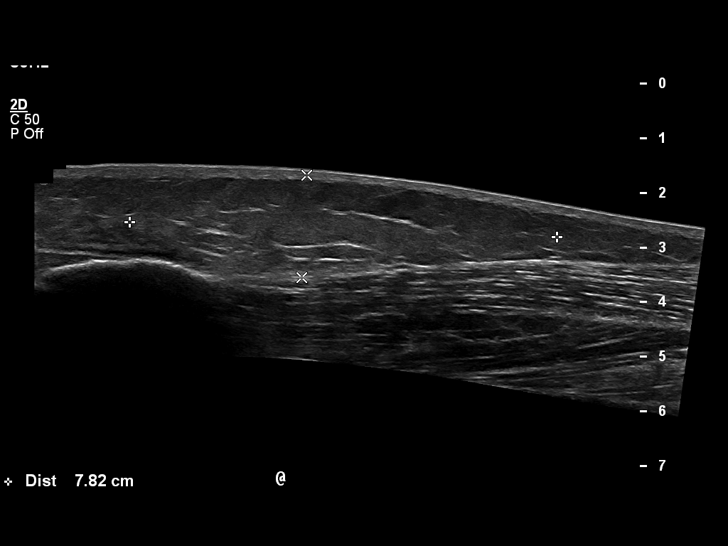
[im 3/13]
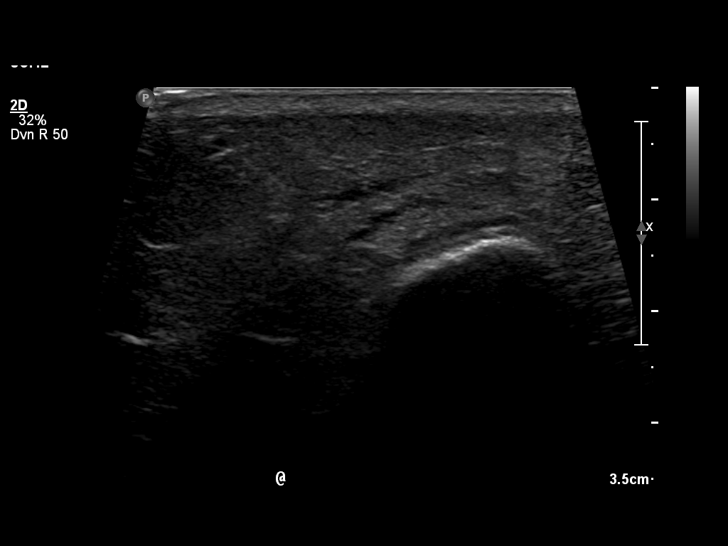
[im 4/13]
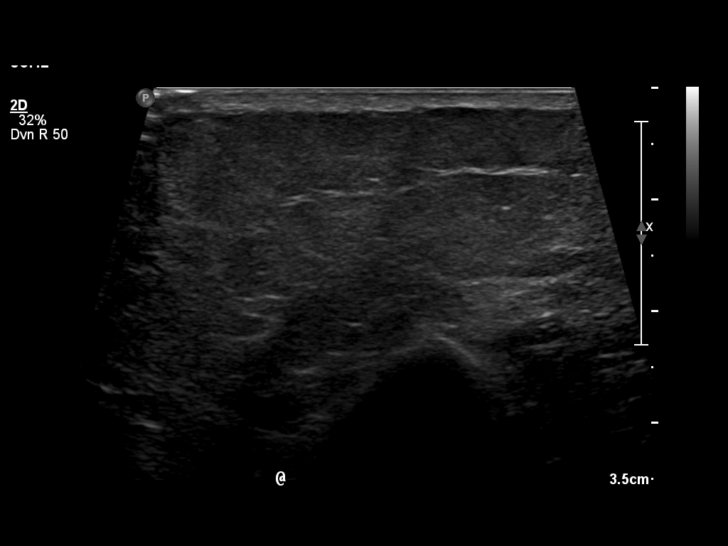
[im 5/13]
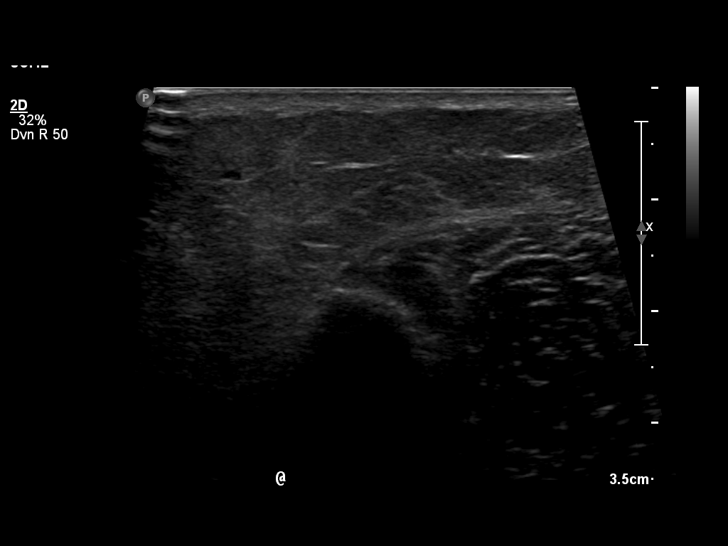
[im 6/13]
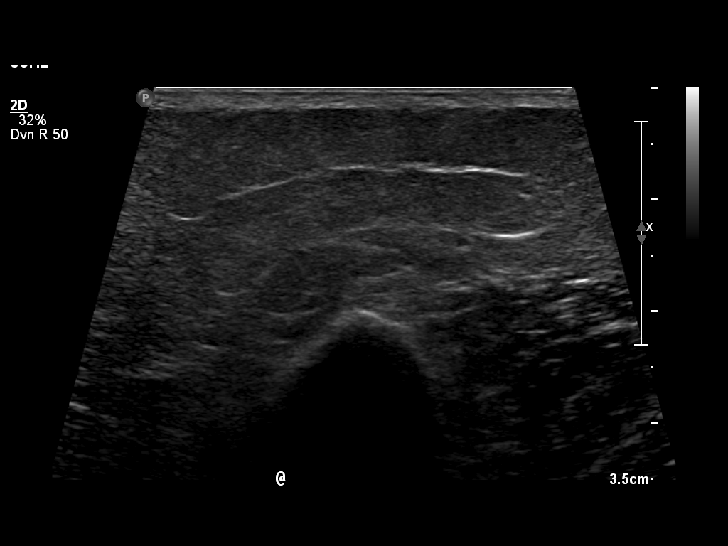
[im 7/13]
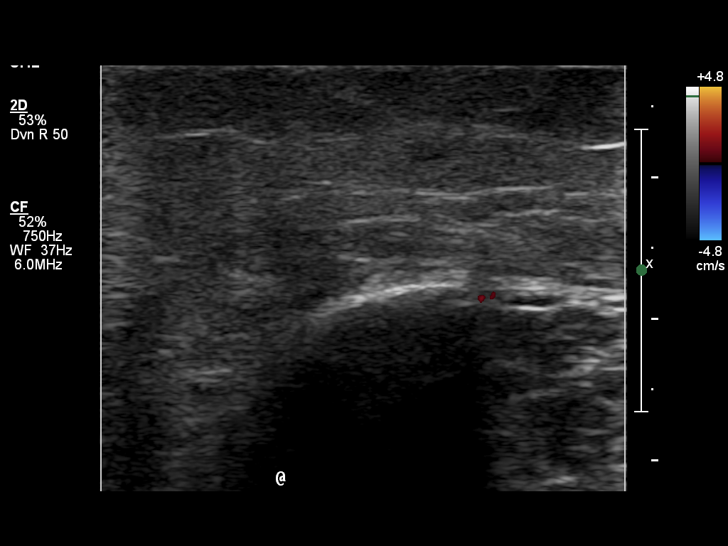
[im 8/13]
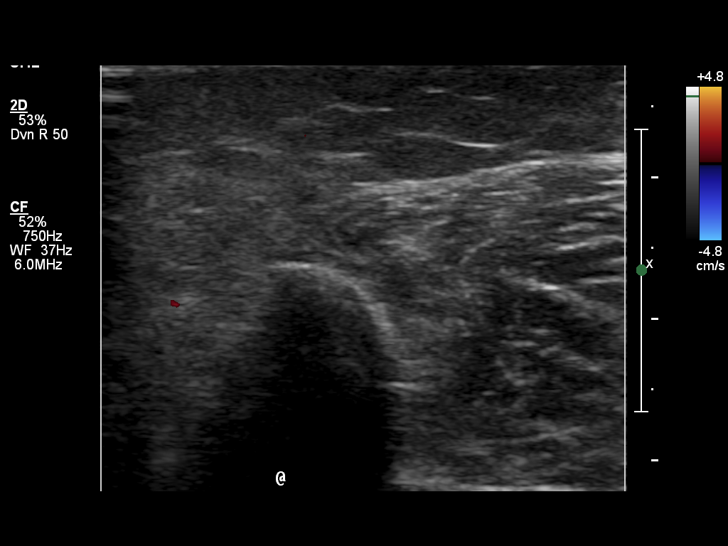
[im 9/13]
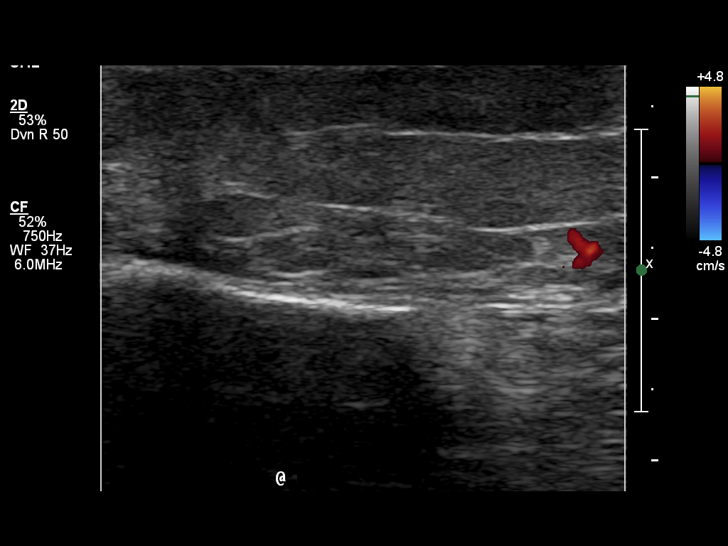
[im 10/13]
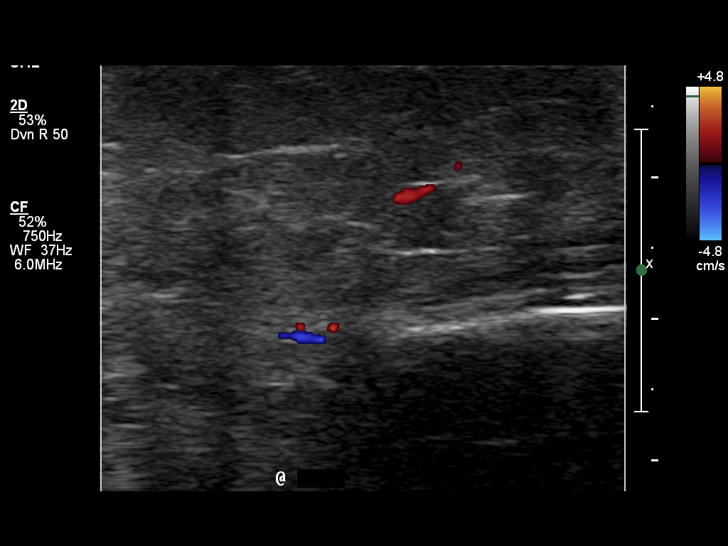
[im 11/13]
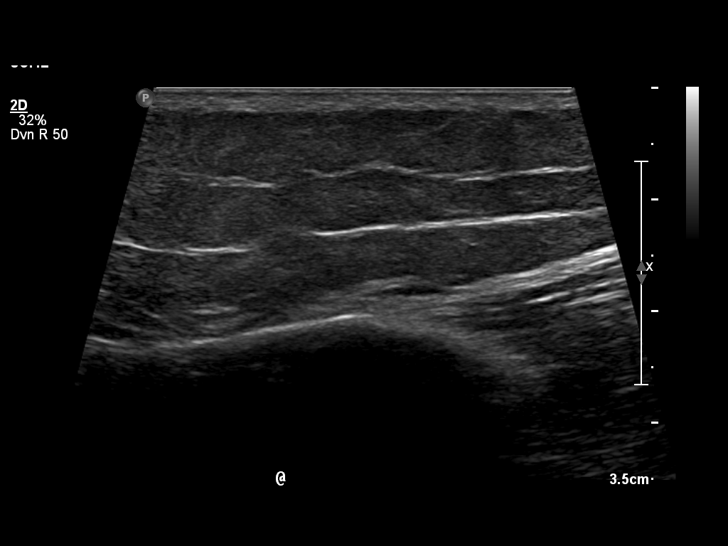
[im 12/13]
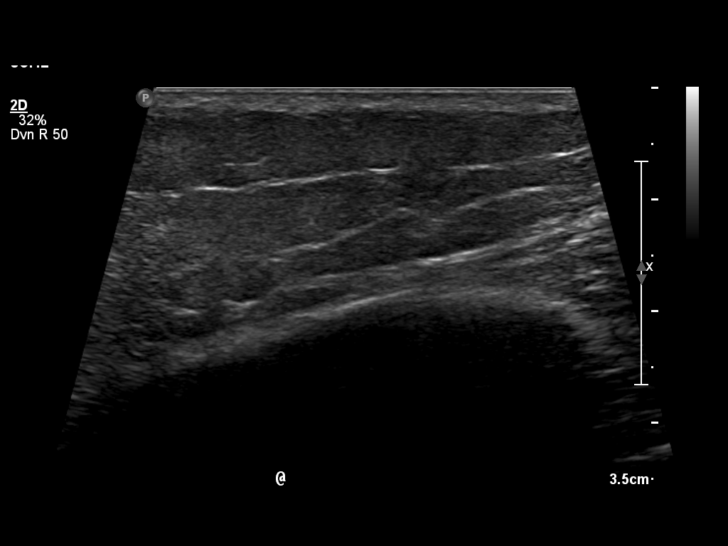
[im 13/13]
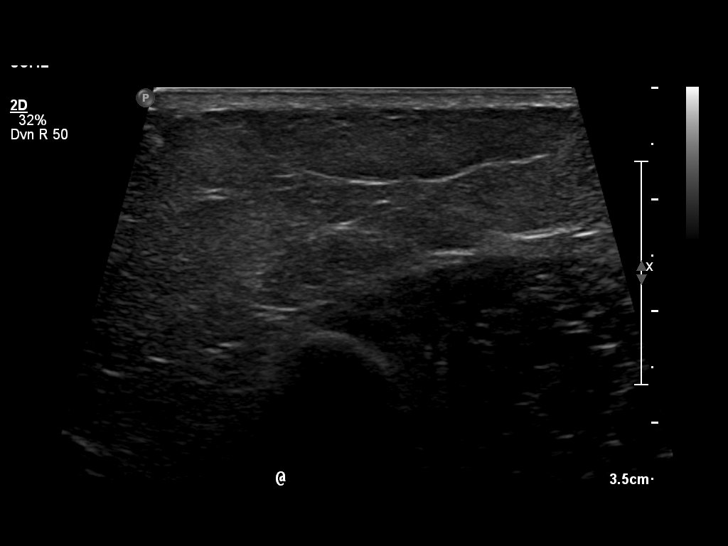

[13 of 13 positions shown; findings below may reference images not displayed]

ULTRASOUND REPORT

DIAGNOSTIC STUDIES

EXAM

Left lower extremity ultrasound.

INDICATION

bilateral anterior leg mass

TECHNIQUE

Directed ultrasound patient's palpable abnormality left lower extremity was obtained.

COMPARISONS

None available.

FINDINGS

No discrete mass or fluid collection is seen at the site of patient's palpable abnormality.
Conceivably, a small lipoma could be present and not visualized. If this becomes progressively
clinically concerning follow-up ultrasound or MRI may be necessary.

IMPRESSION

Negative left lower extremity ultrasound left no concerning masses or fluid collections seen.
Continued clinical follow-up is recommended. If this area becomes progressively clinically
concerning additional imaging may be necessary.

## 2015-09-01 IMAGING — US [PERSON_NAME]
1 series · 12 of 12 positions shown · non-contrast
Comparison: none

[Series 1: us lower extrem right · 12 of 12 slices shown]
[im 1/12]
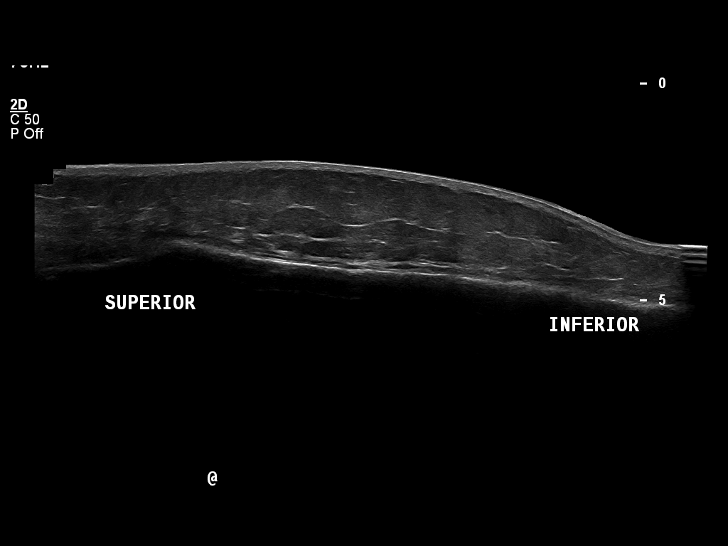
[im 2/12]
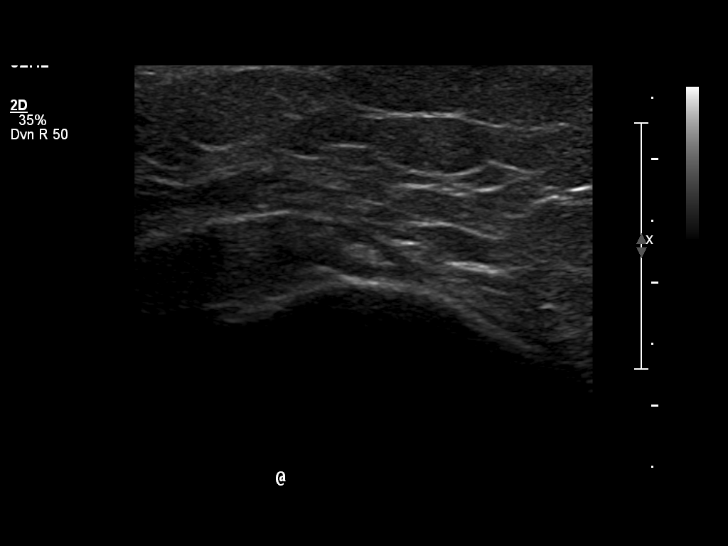
[im 3/12]
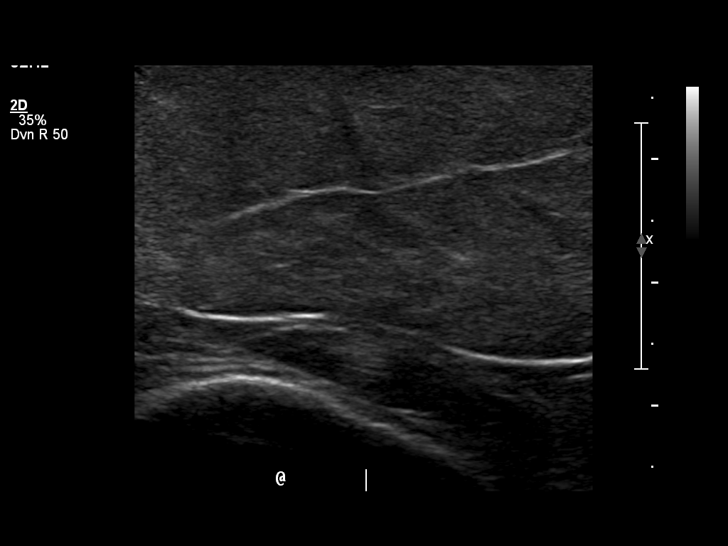
[im 4/12]
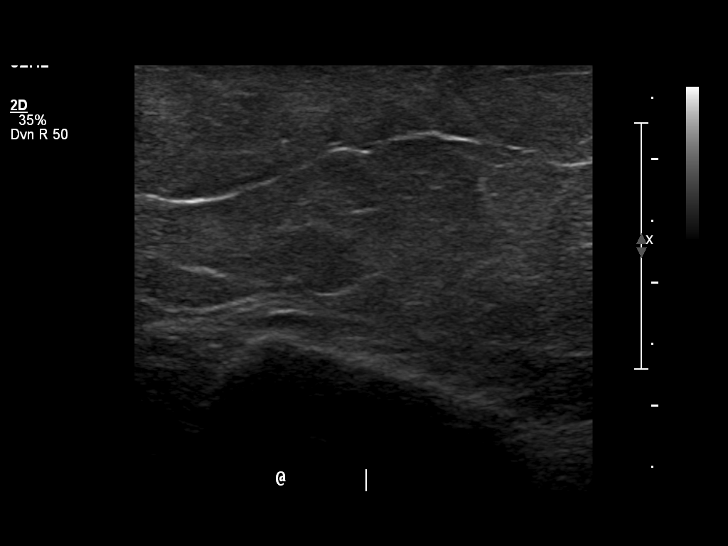
[im 5/12]
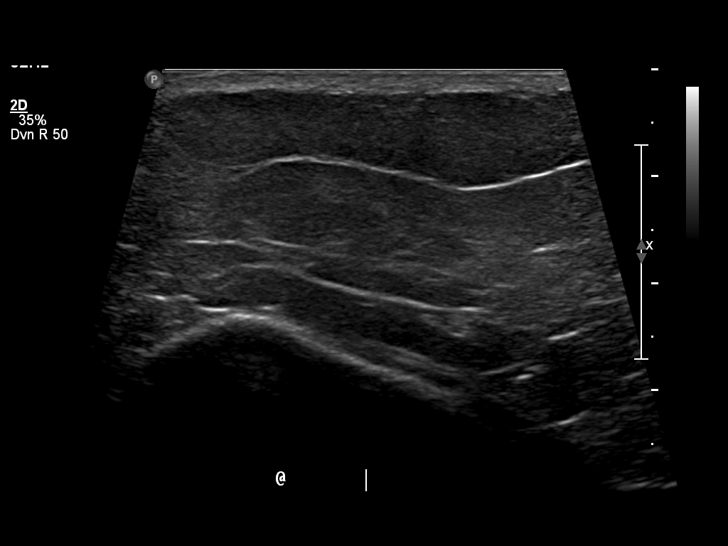
[im 6/12]
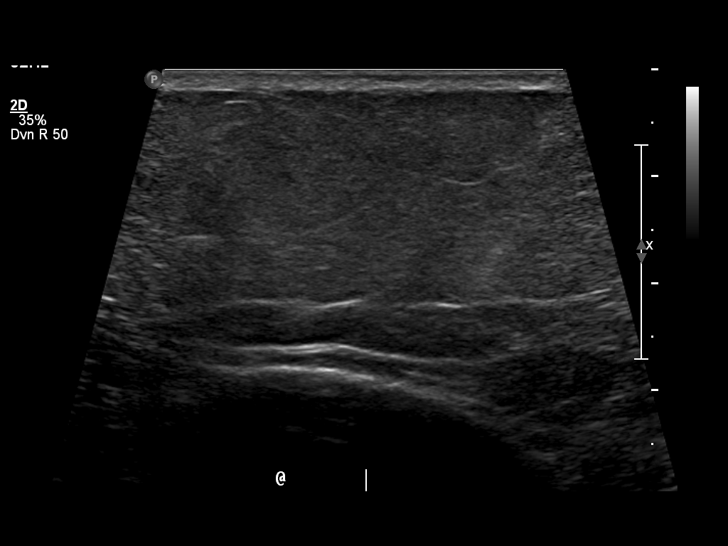
[im 7/12]
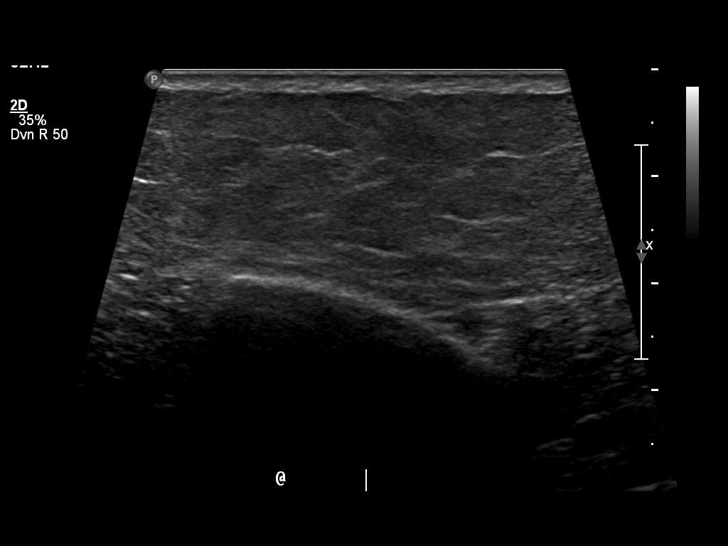
[im 8/12]
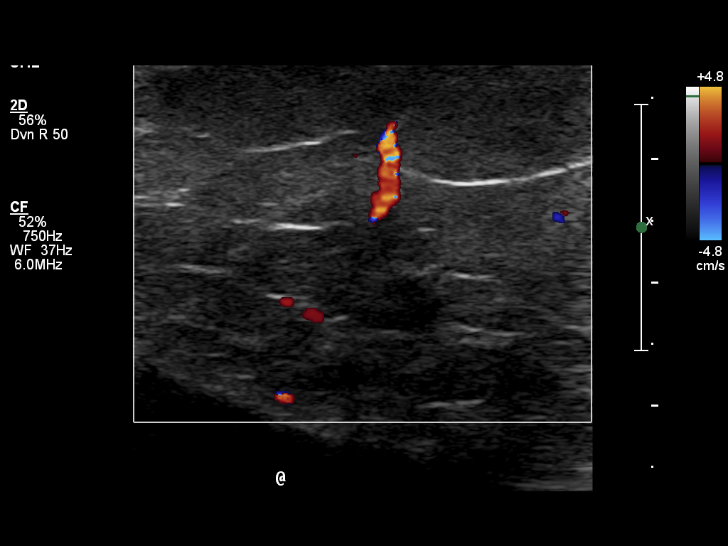
[im 9/12]
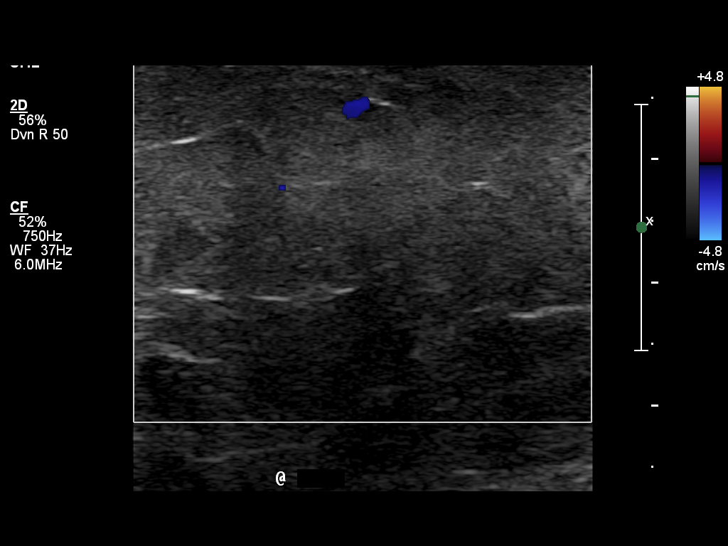
[im 10/12]
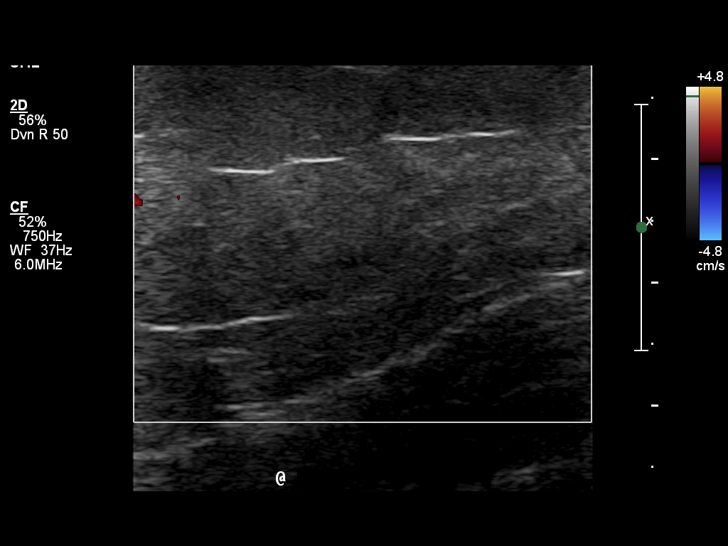
[im 11/12]
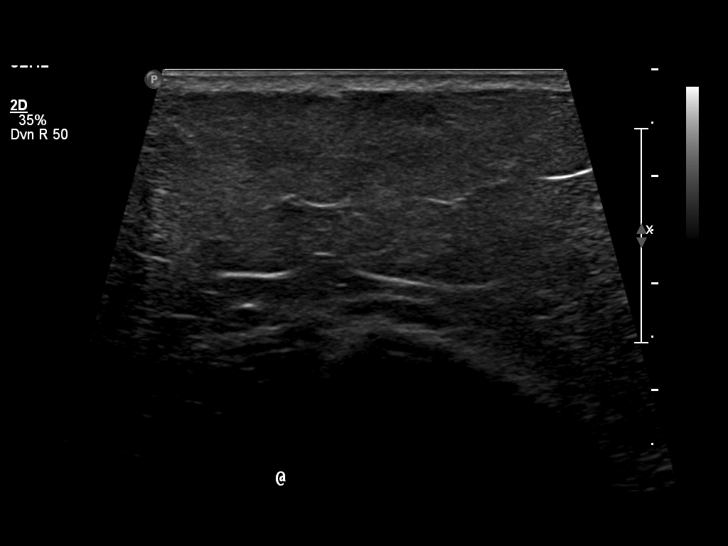
[im 12/12]
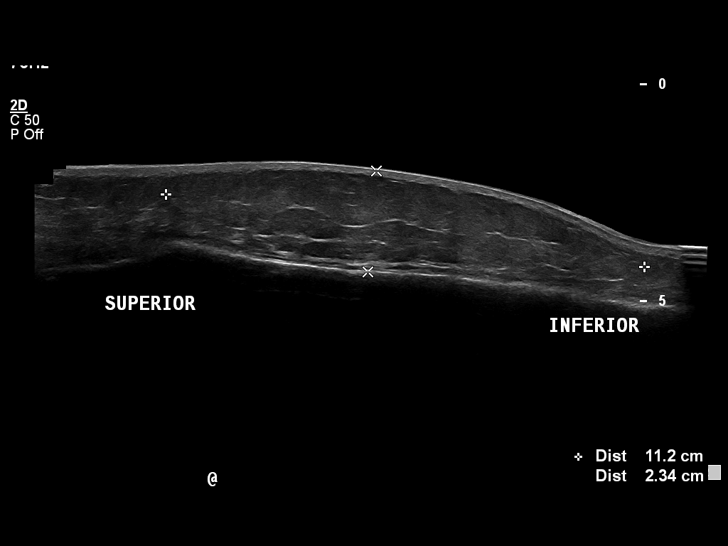

[12 of 12 positions shown; findings below may reference images not displayed]

ULTRASOUND REPORT

DIAGNOSTIC STUDIES

EXAM

Are right lower extremity ultrasound.

INDICATION

bilateral anterior leg mass

TECHNIQUE

Direct ultrasound of patient's palpable abnormality was performed.

COMPARISONS

None available

FINDINGS

At the site of patient's palpable abnormality no discrete underlying mass lesions are seen.
Conceivably a small lipoma make may be present Myrdal visualized. If this becomes progressively
clinically concerning MRI may be necessary.

IMPRESSION

Negative right lower extremity ultrasound. Please see above report. Continued clinical follow-up
is recommended. If this progresses further imaging may be necessary.

## 2015-09-01 IMAGING — US BRL
2 series · 14 of 16 positions shown · non-contrast
Comparison: none

[Series 1: us breast left · 3 of 4 slices shown (1 of 2)]
[im 1/4]
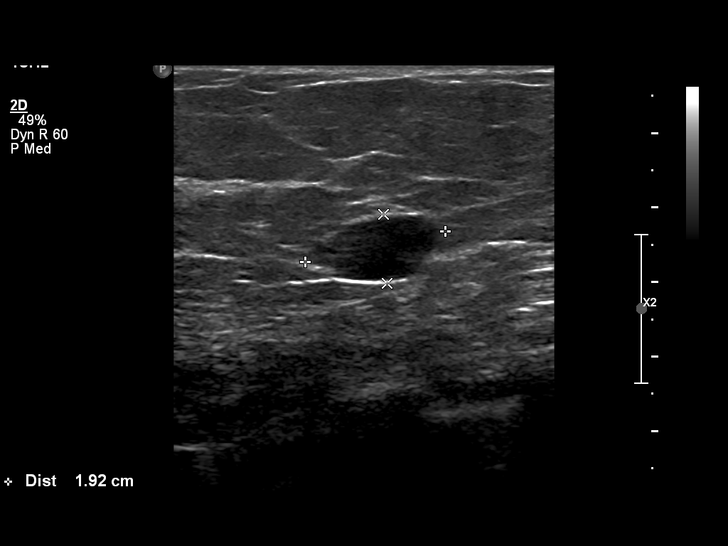
[im 2/4]
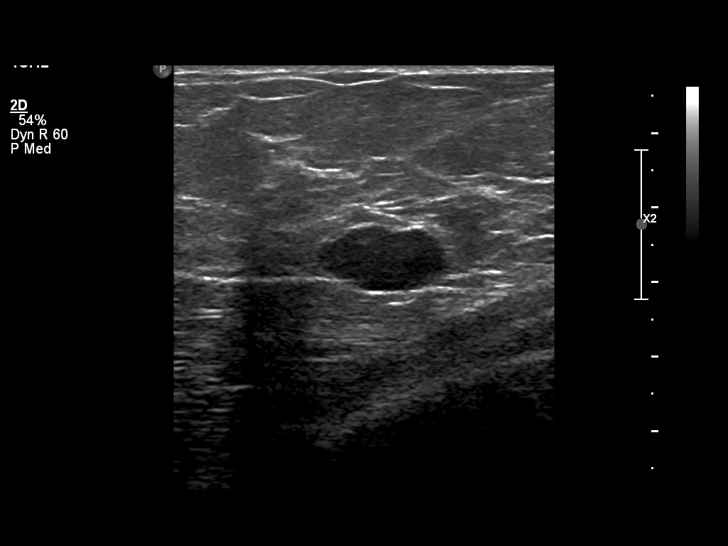
[im 4/4]
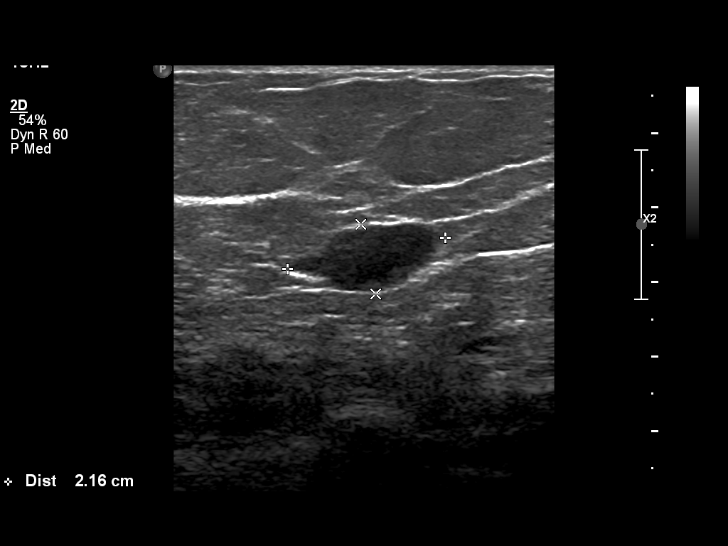

[Series 1: us breast left · 11 of 17 slices shown (2 of 2)]
[im 2/17]
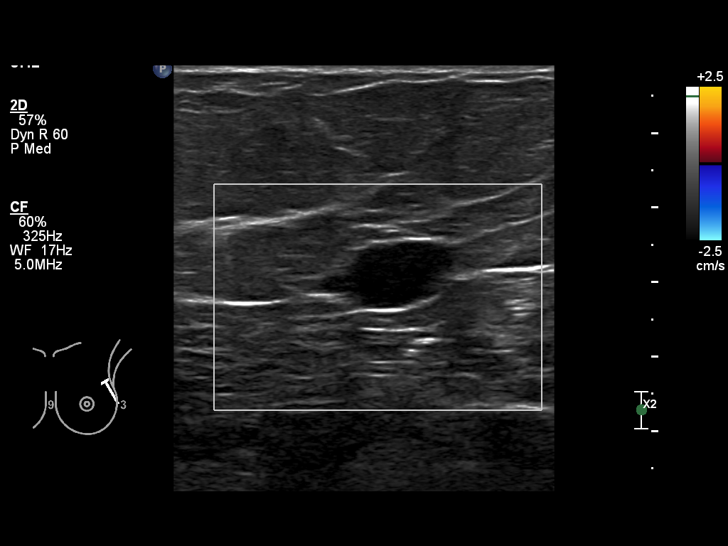
[im 3/17]
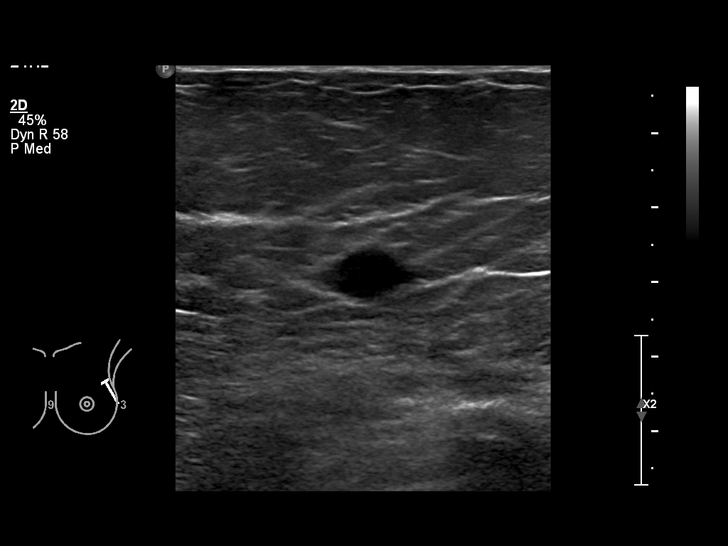
[im 5/17]
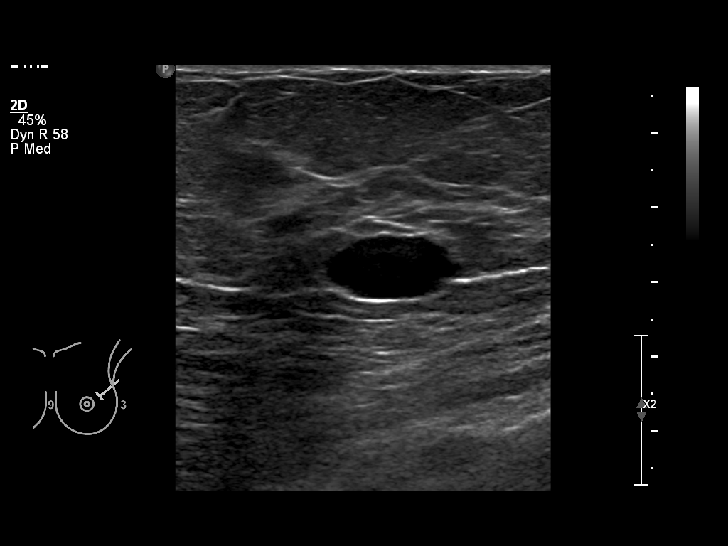
[im 6/17]
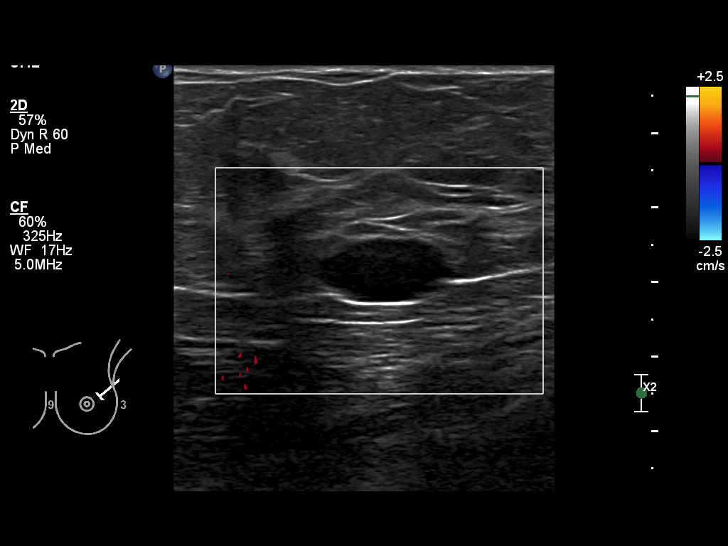
[im 7/17]
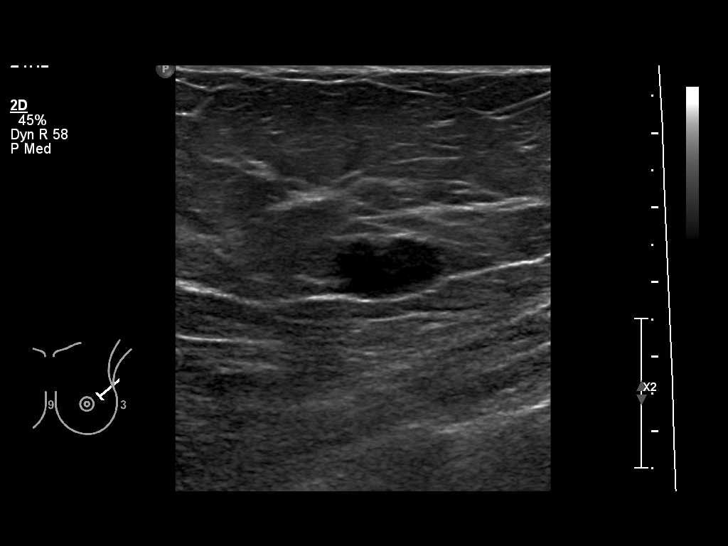
[im 9/17]
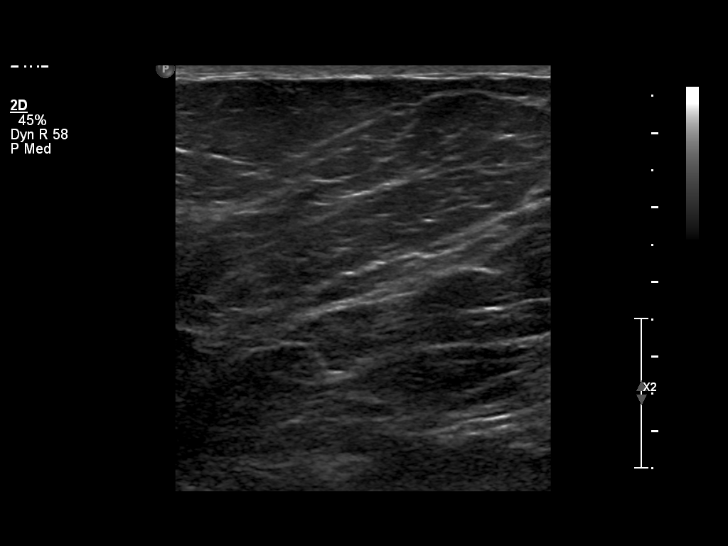
[im 10/17]
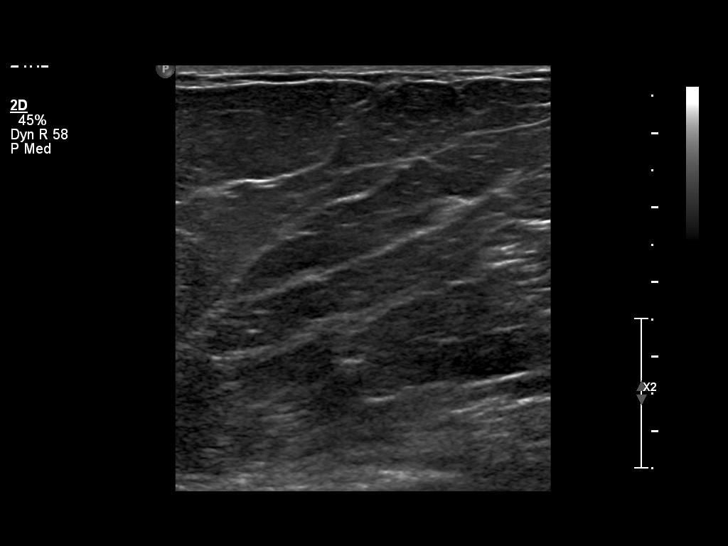
[im 13/17]
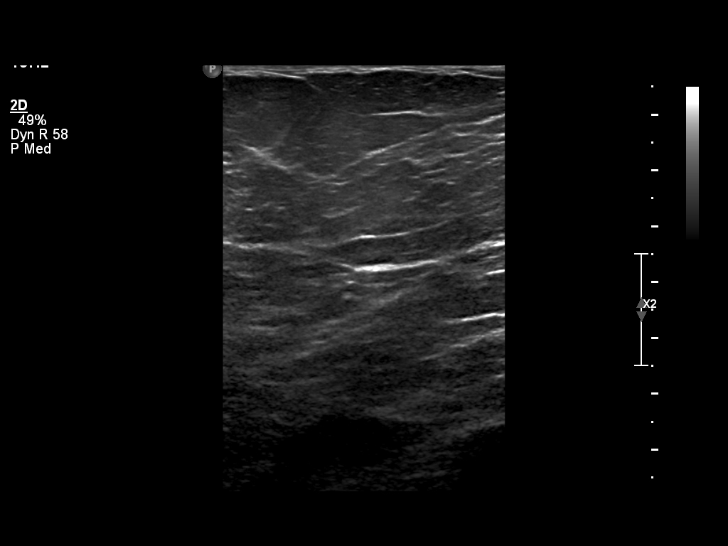
[im 14/17]
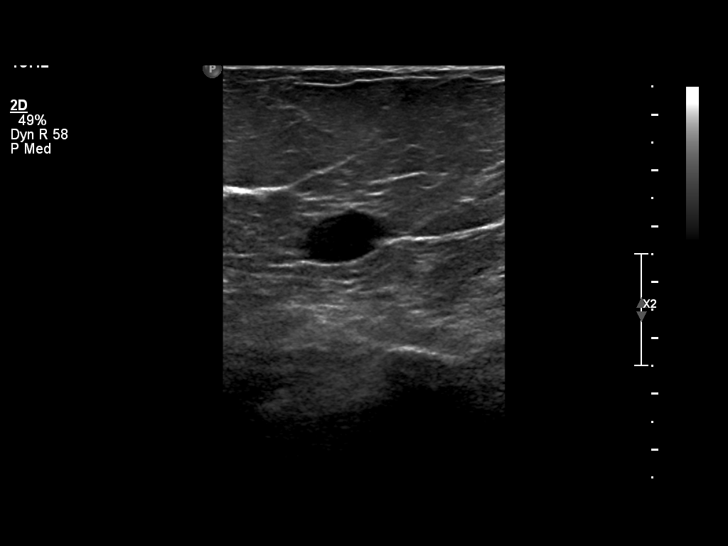
[im 15/17]
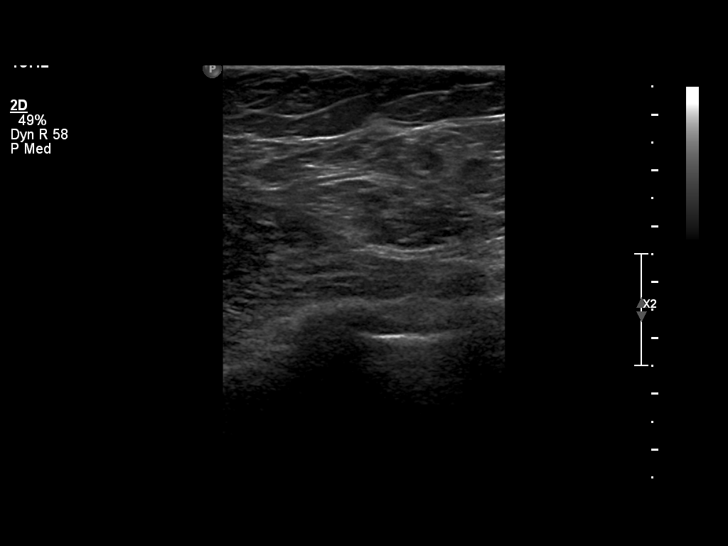
[im 17/17]
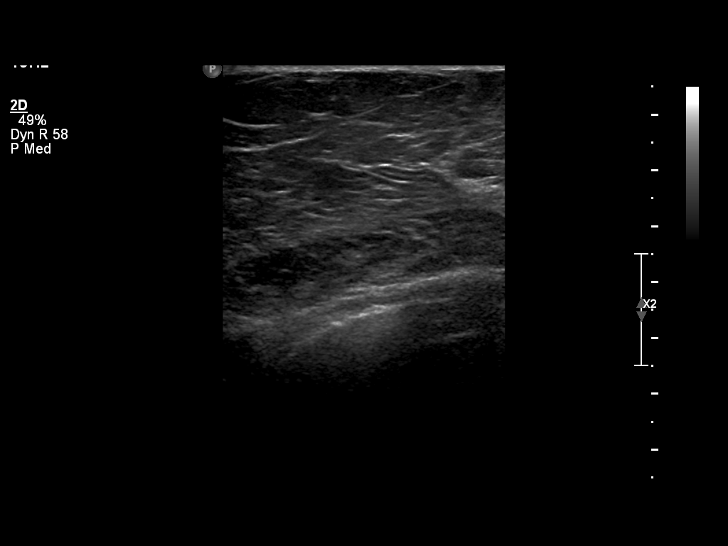

[14 of 16 positions shown; findings below may reference images not displayed]

ULTRASOUND REPORT

DIAGNOSTIC STUDIES

EXAM

Left breast ultrasound.

INDICATION

"nodule" seen on mammogram screen

TECHNIQUE

Directed ultrasound left breast was obtained evaluate mammographic abnormality.

COMPARISONS

None available

FINDINGS

There is a slightly lobulated hypoechoic area the left breast at 2 o'clock 13 centimeters from the
nipple. This may represent a complex cyst versus hypoechoic mass. Initially ultrasound-guided
aspiration is recommended followed by ultrasound biopsy if fluid cannot be aspirated.

IMPRESSION

Indeterminate lesion in left breast for which initially ultrasound-guided aspiration is
recommended and if need be ultrasound-guided biopsy.

## 2015-09-06 IMAGING — US USNDLPLAC
1 series · 3 of 3 positions shown · non-contrast
Comparison: none

[Series 1: us guided needle placement · 3 of 3 slices shown]
[im 1/3]
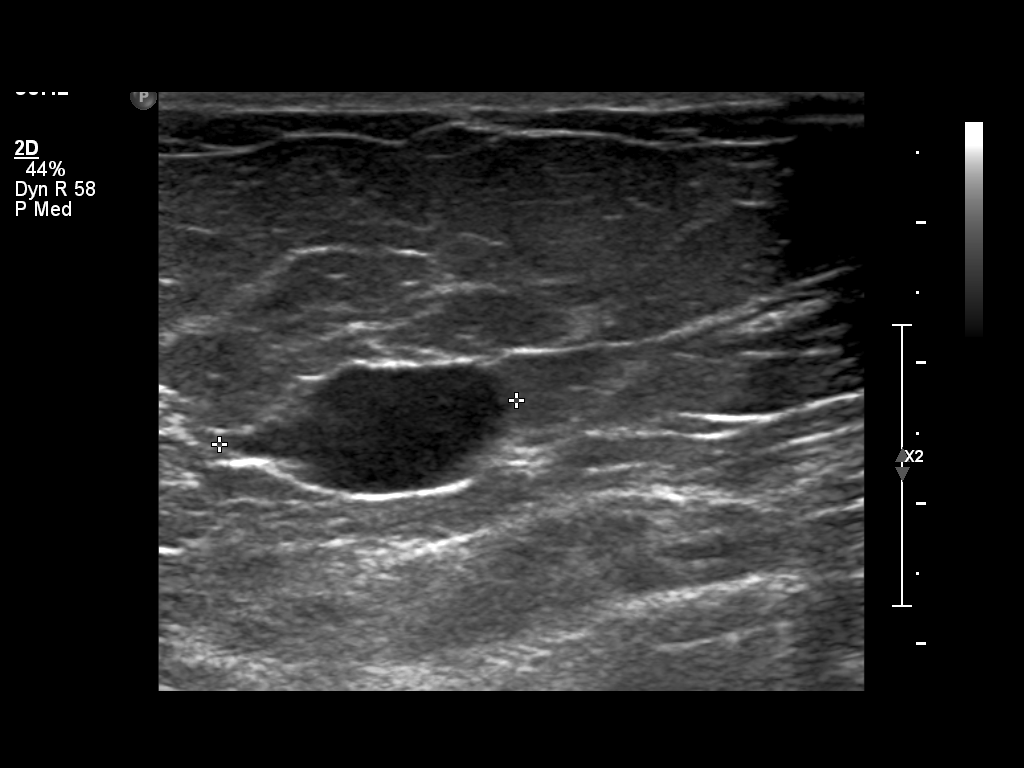
[im 2/3]
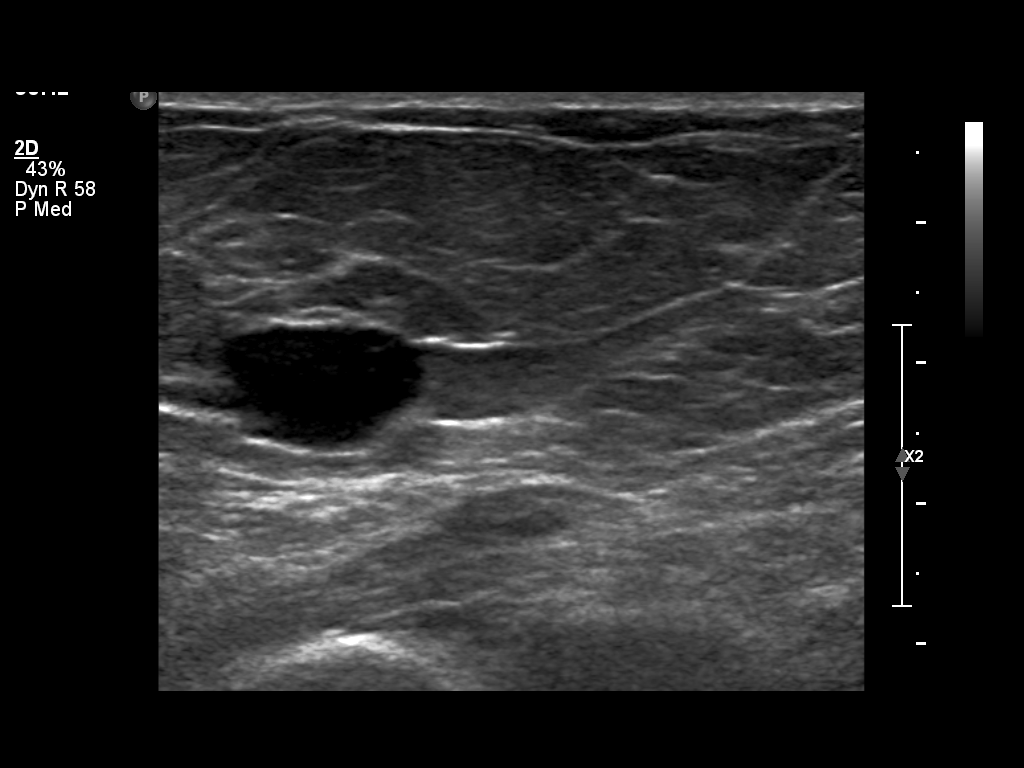
[im 3/3]
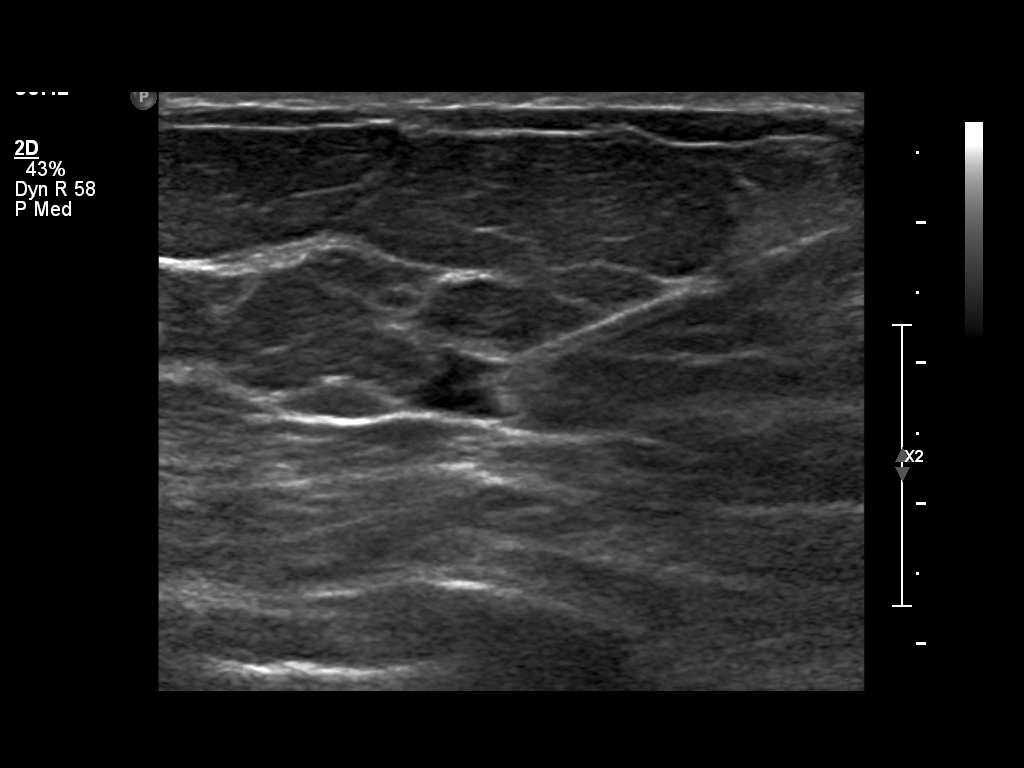

[3 of 3 positions shown; findings below may reference images not displayed]

ULTRASOUND REPORT

DIAGNOSTIC STUDIES

EXAM

RIGHT BREAST ASPIRATION

INDICATION

Lesion in left breast

COMPARISONS

09/01/2015

PROCEDURE

Because of an indeterminate lesion in the left breast at 2 o'clock, patient is referred for cyst
aspiration and if unsuccessful, sonographically guided percutaneous biopsy. A 2.1 centimeter
sharply demarcated hypoechoic lesion is present and confirmed in the 2 o'clock position of the left
breast approximately 13 centimeters from the nipple.

The procedure and complications were explained to the patient understood, consented common wished
proceed. Using local anesthesia,, aseptic technique, and sonographic guidance, a 20 gauge needle
was placed into the indeterminate lesion in the 2 o'clock position of the left breast. Under real-
time sonography, 3 cc of serosanguineous fluid would removed. The fluid was sent to the lab for
analysis. No apparent complications occurred curtain procedure. Patient was discharged home in
stable condition.

IMPRESSION

Sonographically guided cyst aspiration of the indeterminate lesion the 2 o'clock position of the
left breast as described above without complication. Routine screening mammography is recommended.

## 2015-09-28 IMAGING — CR LOW_EXM
3 series · 3 of 3 positions shown · non-contrast
Comparison: none

[foot]
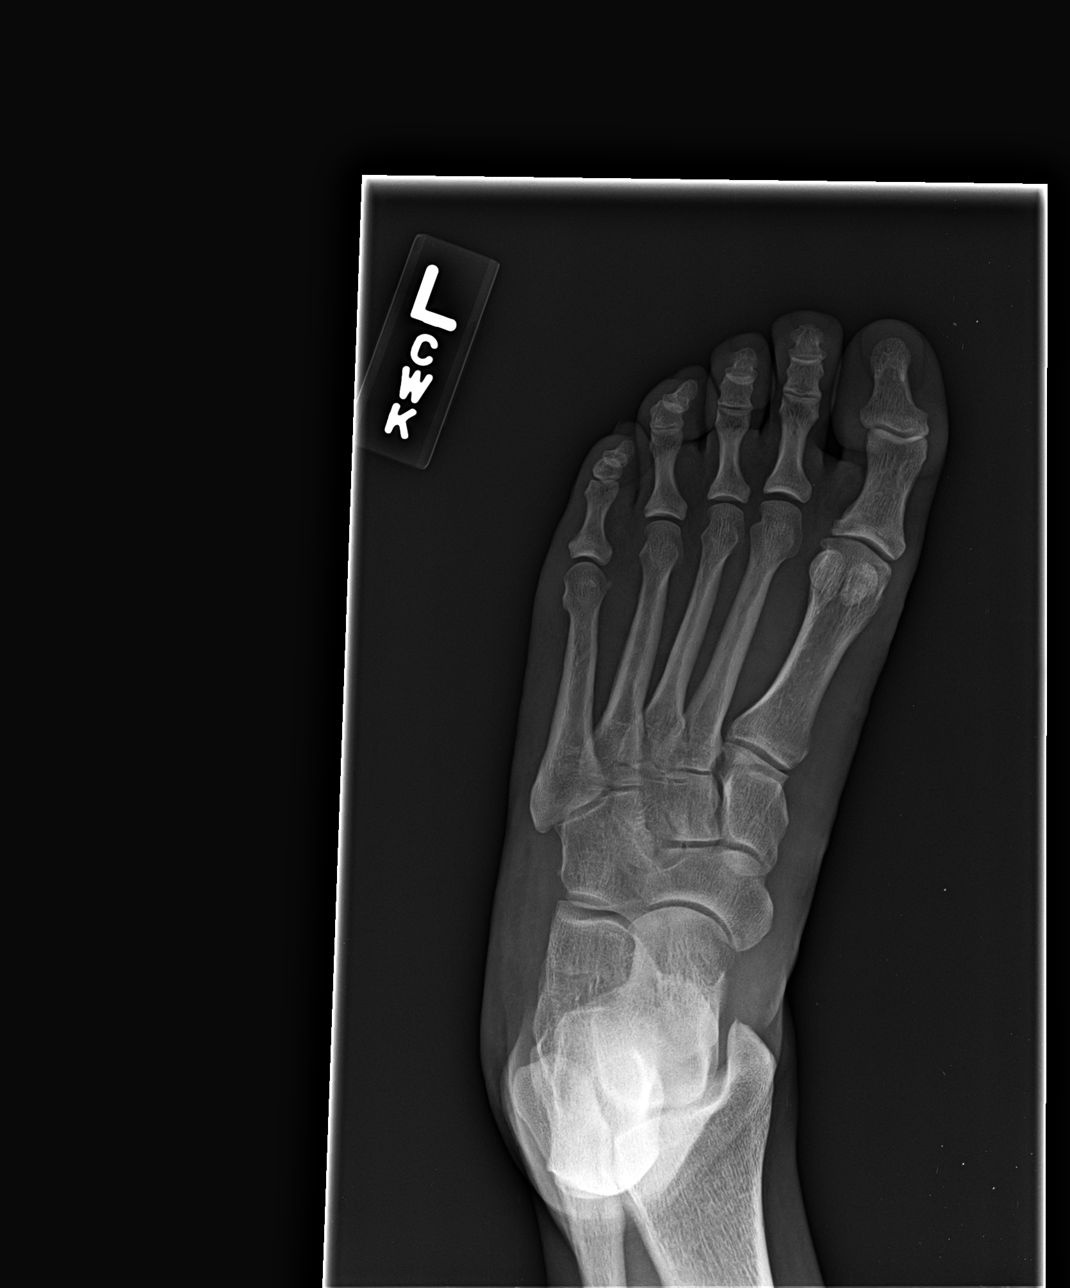

[foot obl]
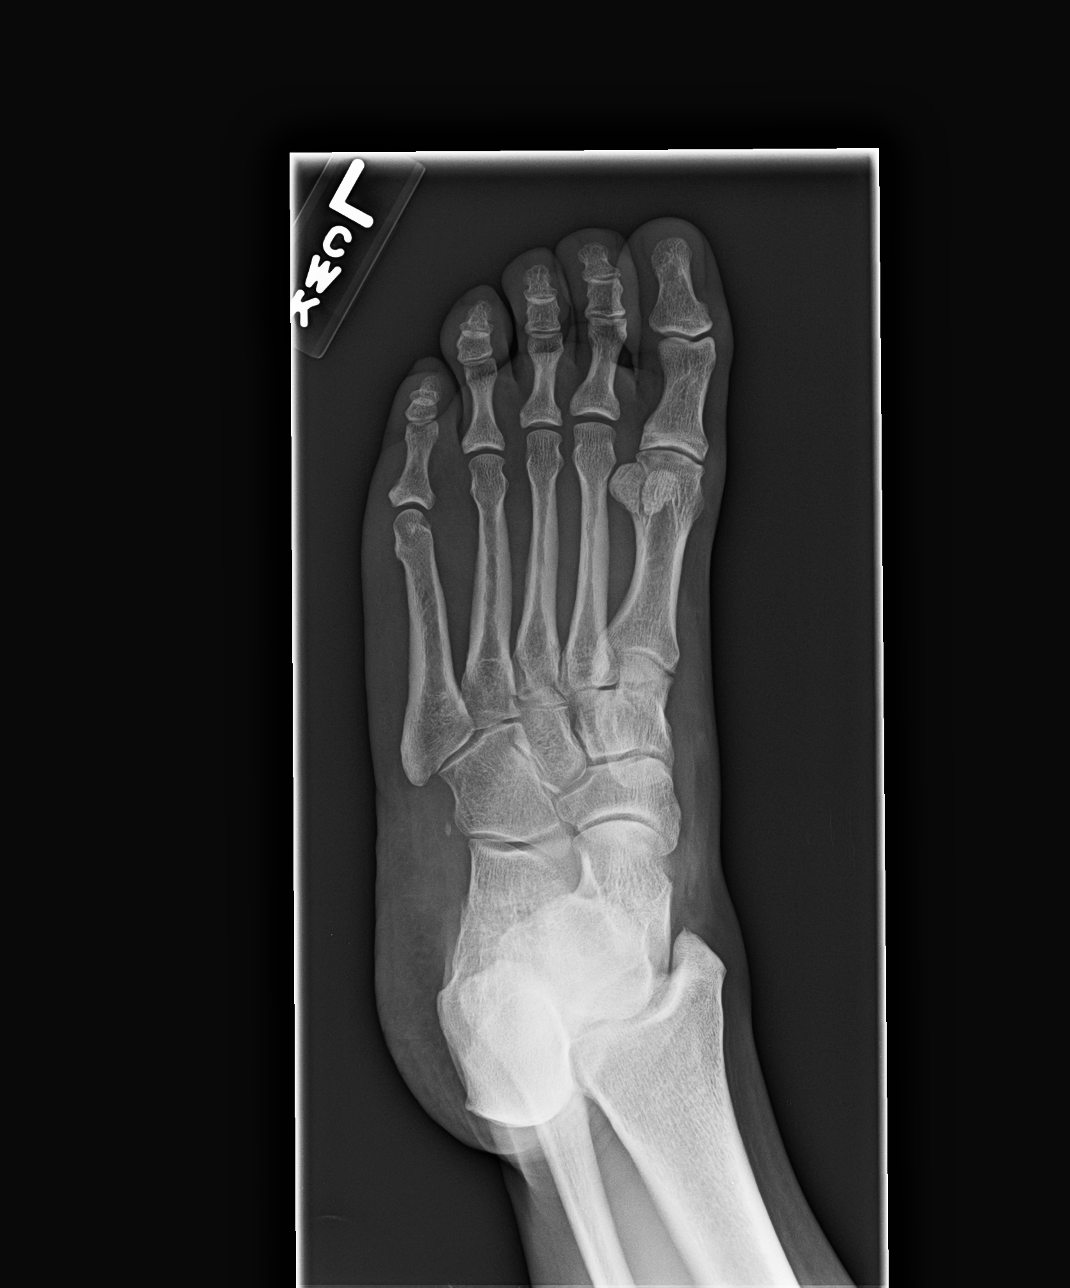

[foot lat]
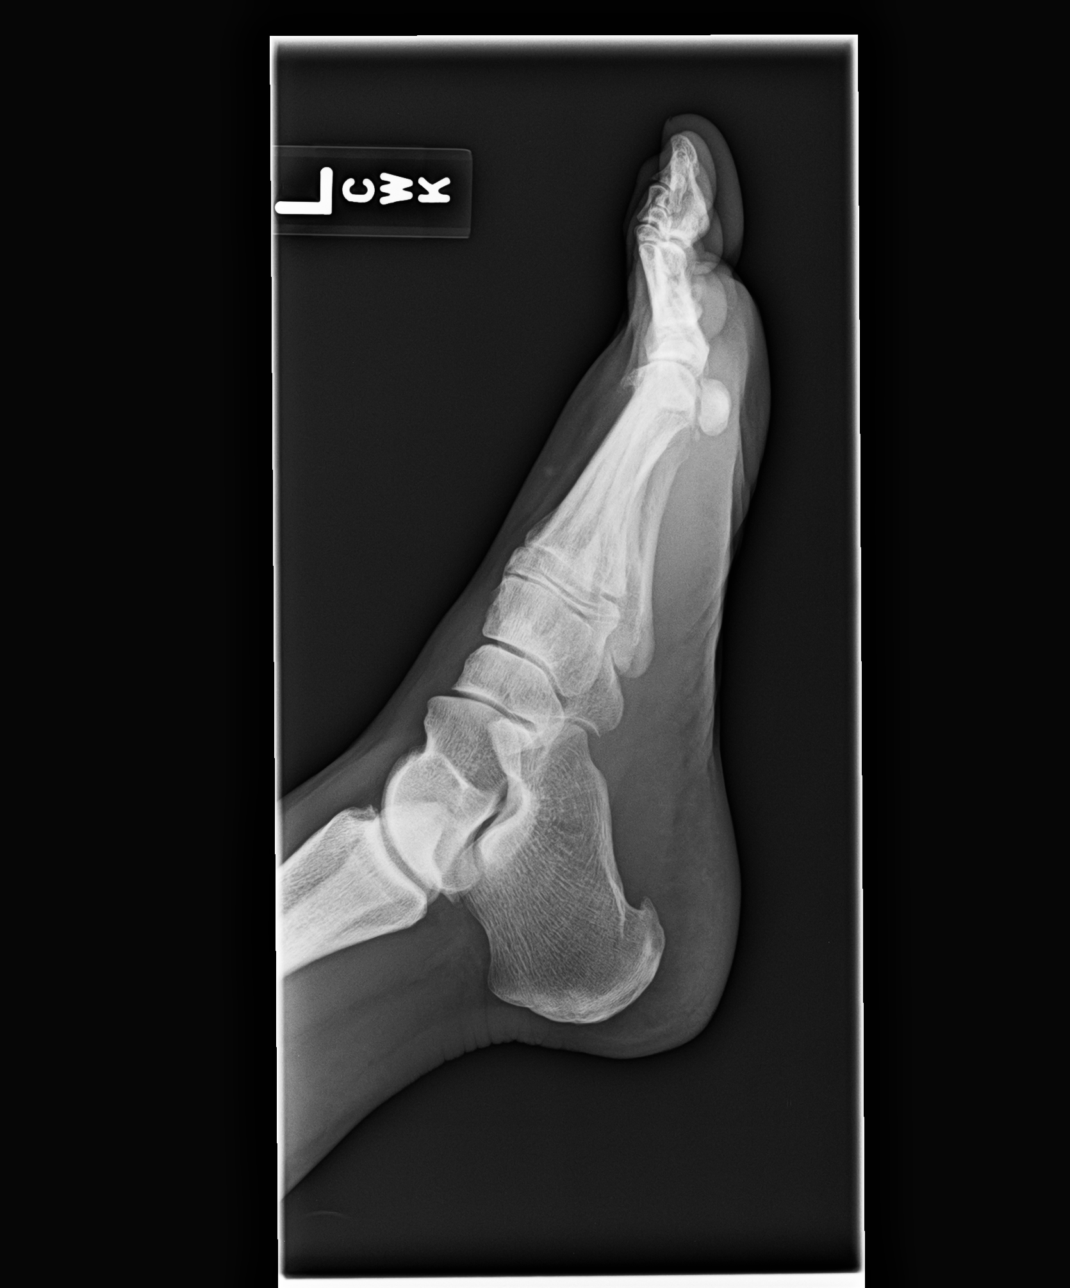

[3 of 3 positions shown; findings below may reference images not displayed]

EXAM
RADIOLOGICAL EXAMINATION, FOOT; COMPLETE
3 OR MORE VIEWS CPT 12328

INDICATION
LEFT 2ND TOE PAIN
PT. C/O 2ND DIGIT PAIN WITH PAIN AT MTP INTO FOOT.

TECHNIQUE
3 views of the left foot were acquired.

COMPARISONS
Previous examination 08/30/2015.

FINDINGS
There is redemonstration of moderate degenerative changes of the 1st metatarsophalangeal joint.
The remainder of the metatarsophalangeal joints in the interphalangeal joints as well as the tarsal
joints appear intact.
There are no fractures or subluxations of the left foot. There are no abnormal masses or
calcifications. There are no blastic or lytic lesions.

IMPRESSION
No acute abnormalities. Redemonstration of moderate degenerative changes of the 1st
metatarsophalangeal joint.

## 2015-10-31 IMAGING — CR ABDOMEN
3 series · 3 of 3 positions shown · non-contrast
Comparison: none

[chest pa]
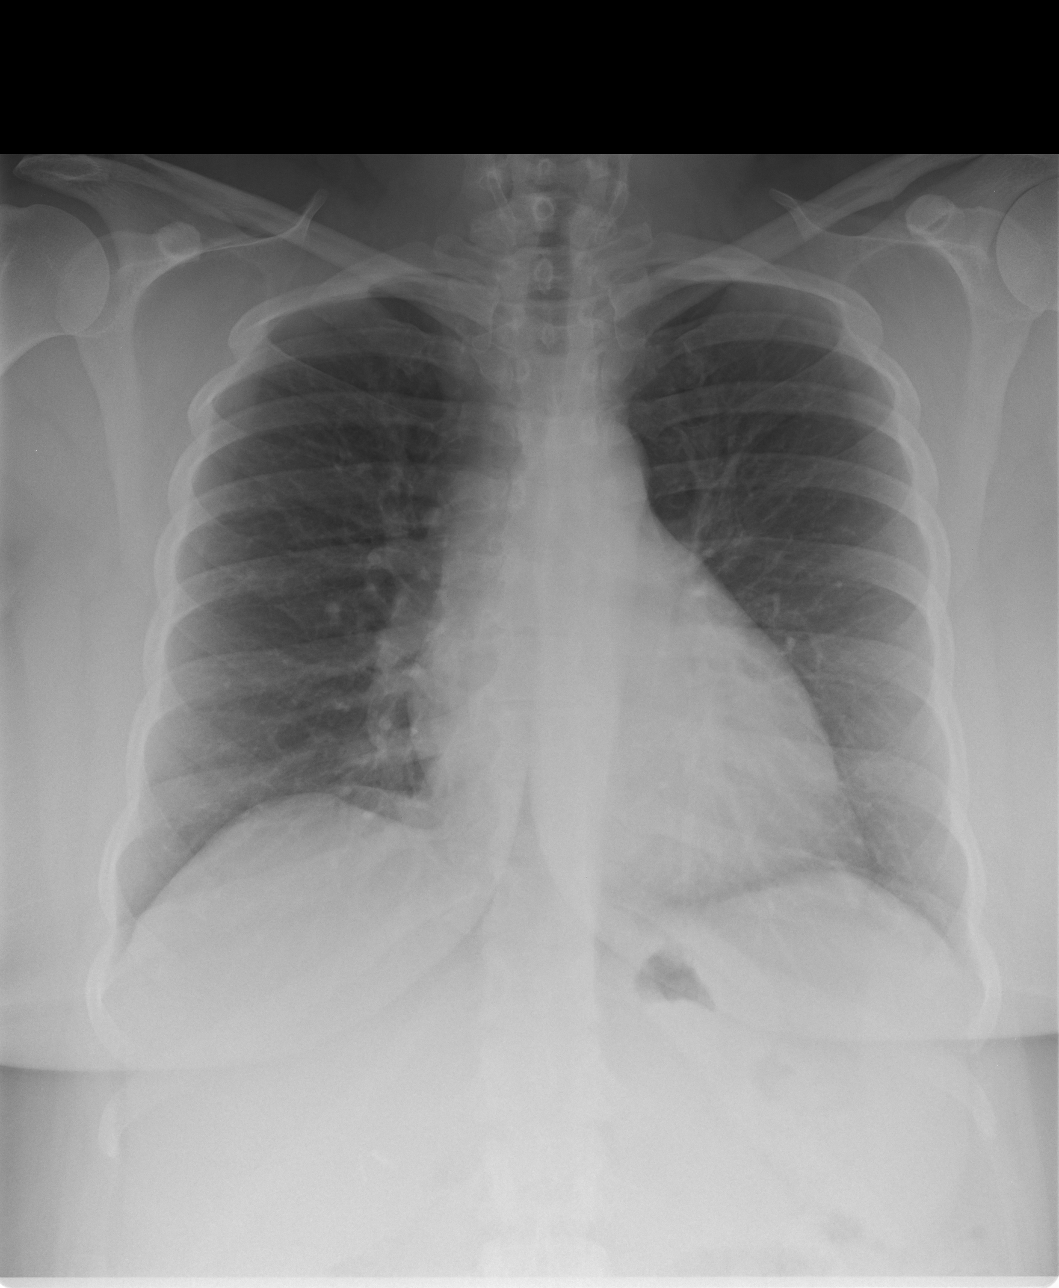

[abdomen upright]
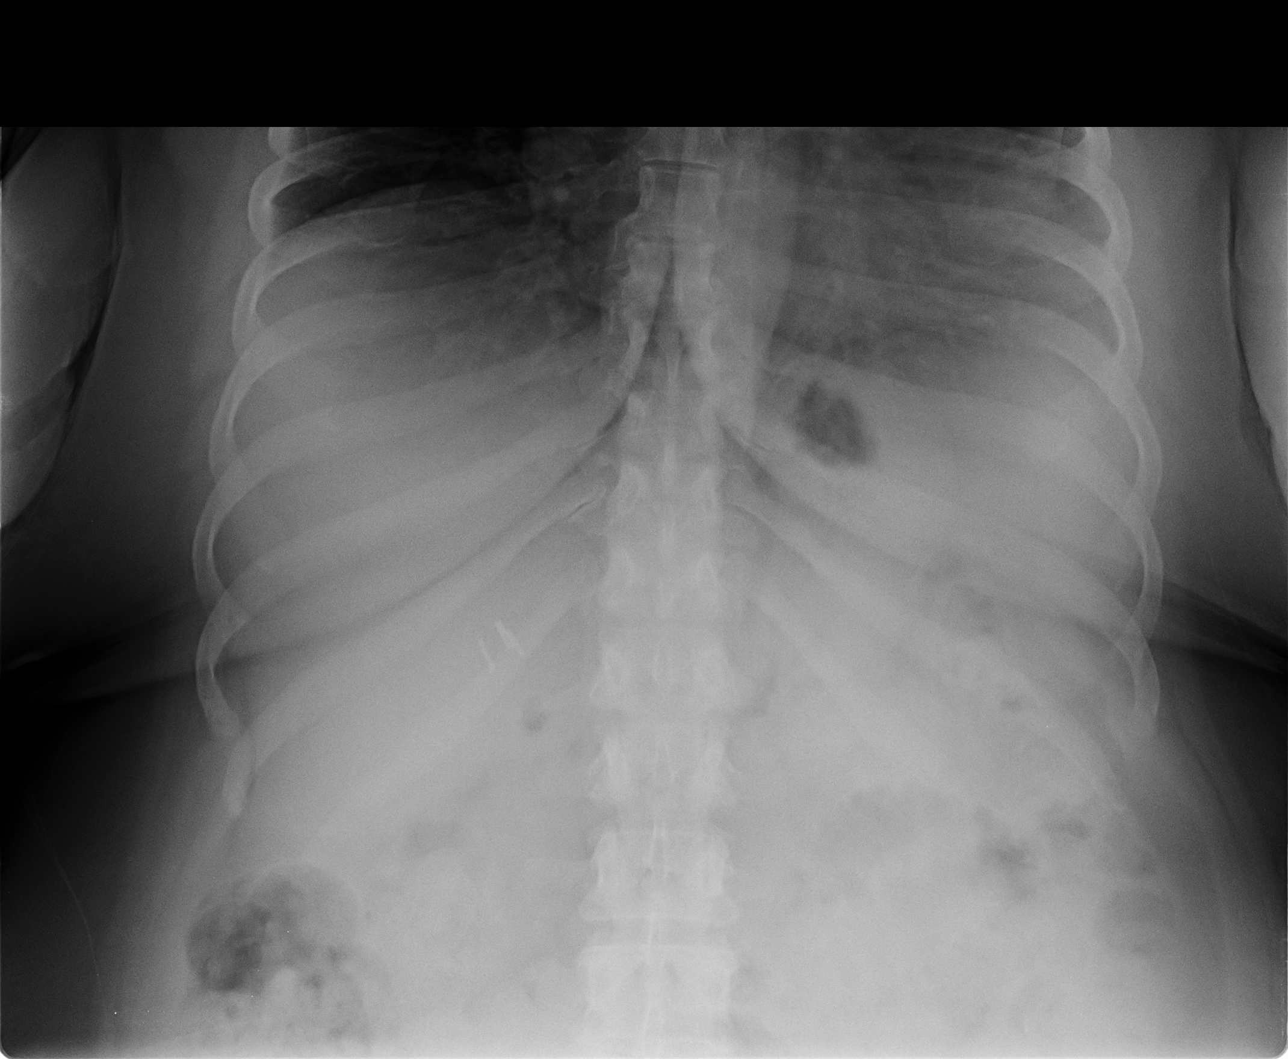

[abdomen supine kub]
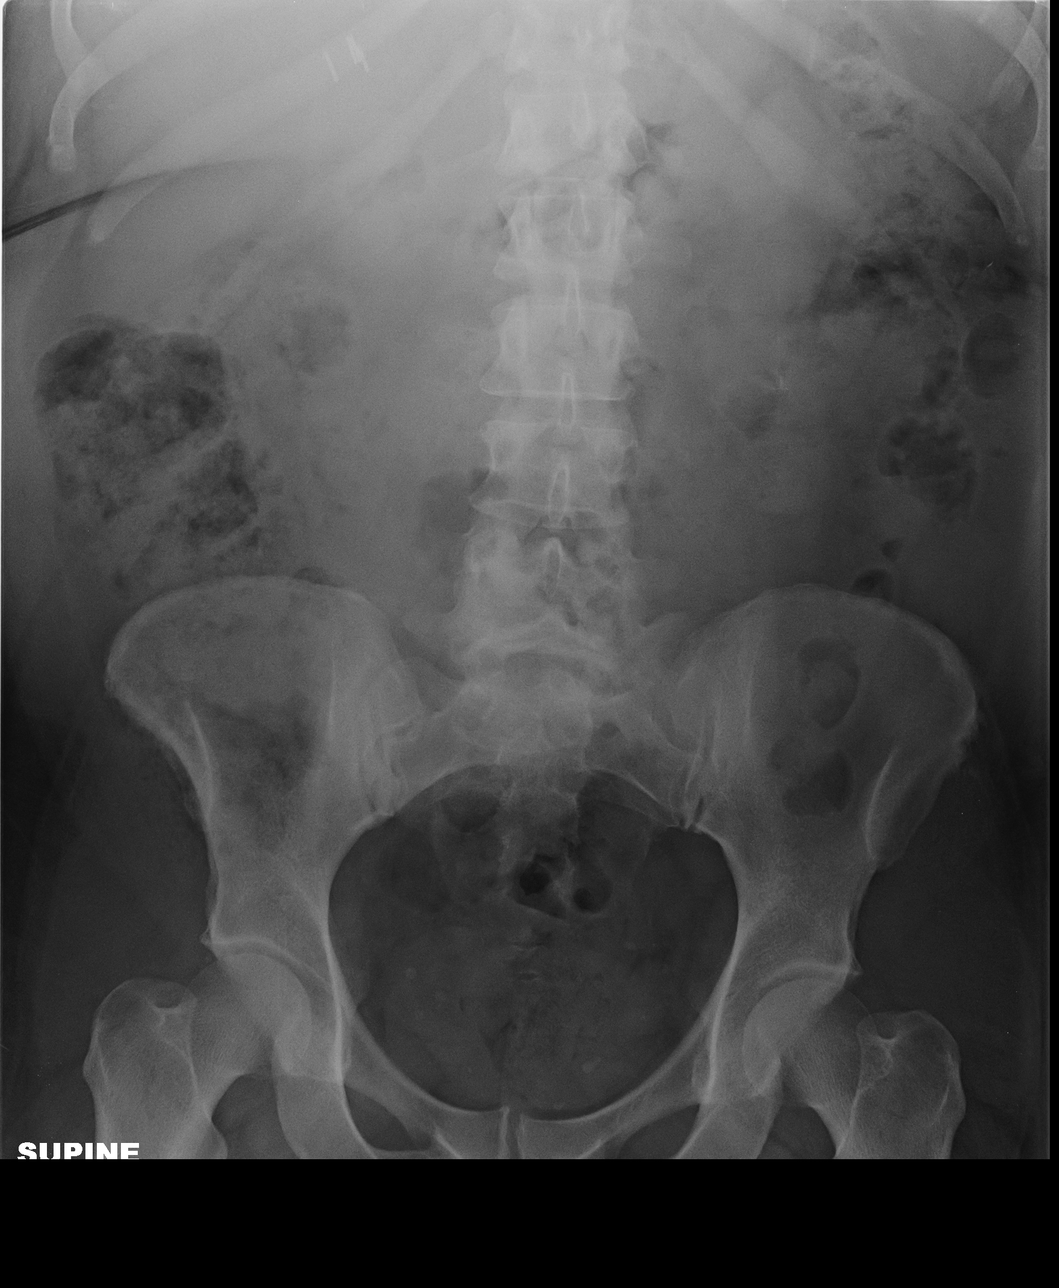

[3 of 3 positions shown; findings below may reference images not displayed]

DIAGNOSTIC STUDIES

EXAM
XR abdominal obstructive series with PA series

INDICATION
Low abdominal pain s/p uterine biopsey
PT STATES HAD A UTERINE BX DONE LAST WK. BILATERAL FLANK PAIN OVER THE
WKEND. TJ/JS

TECHNIQUE
Frontal radiograph of the chest and frontal upright and supine radiographs of the abdomen

COMPARISONS
None

FINDINGS
The heart size is normal and the lungs are clear. There is no free intraperitoneal gas on the
upright view. The bowel gas is nonspecific nonobstructive.

IMPRESSION
No acute cardiopulmonary abnormality, radiographic evidence of bowel obstruction, or free
intraperitoneal gas.

## 2016-01-06 IMAGING — CT Head^_WITHOUT_CONTRAST (Adult)
1 series · 16 of 28 positions shown, 20 images · non-contrast
Comparison: none

[Series 2: brain w/o 4.8 brain · axial · non-contrast · 0.39mm/px · z∈[+95,+231]mm · 16 of 28 slices shown, 20 images]
[im 2/28  brain]
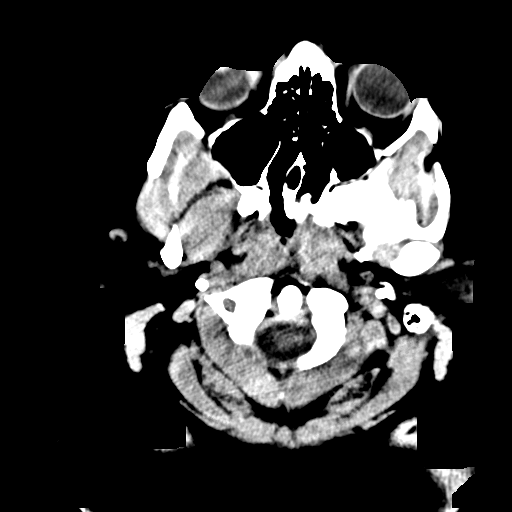
[im 2/28  bone]
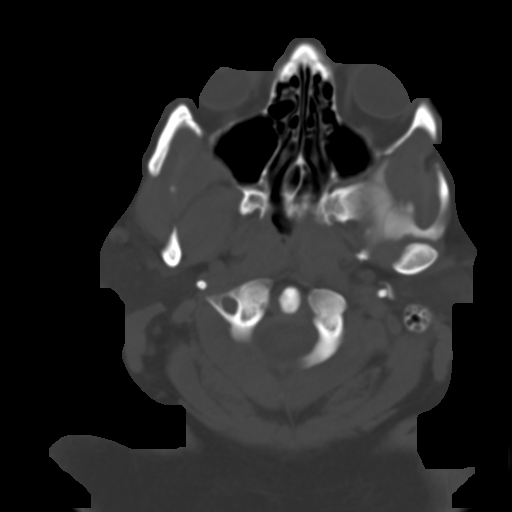
[im 4/28  brain]
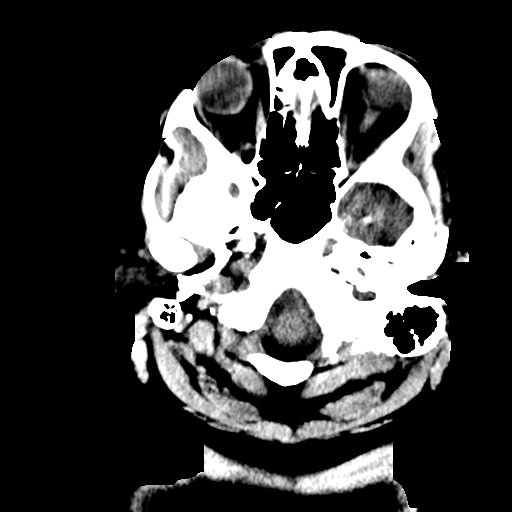
[im 6/28  brain]
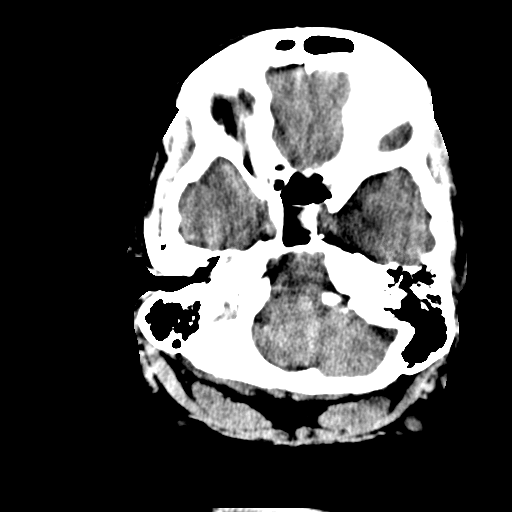
[im 7/28  brain]
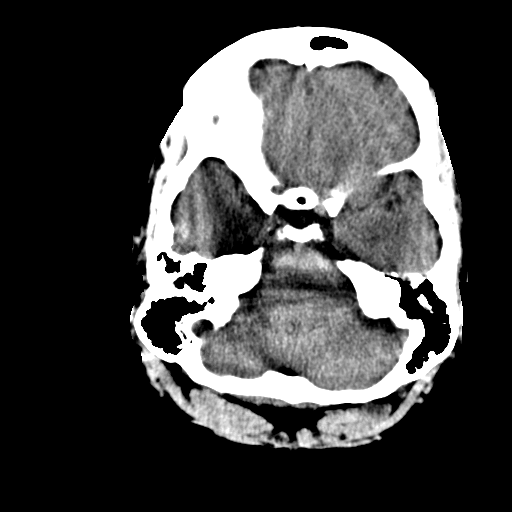
[im 9/28  brain]
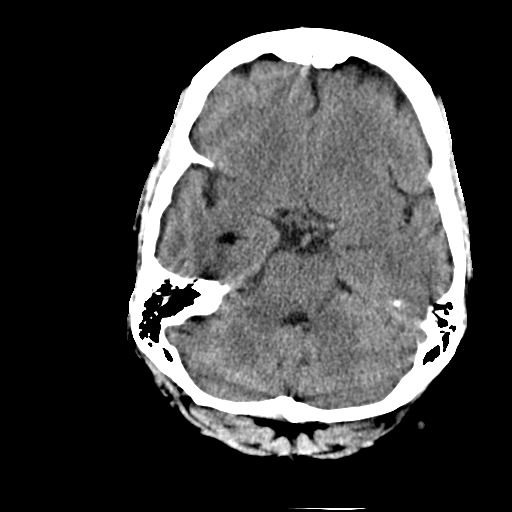
[im 9/28  bone]
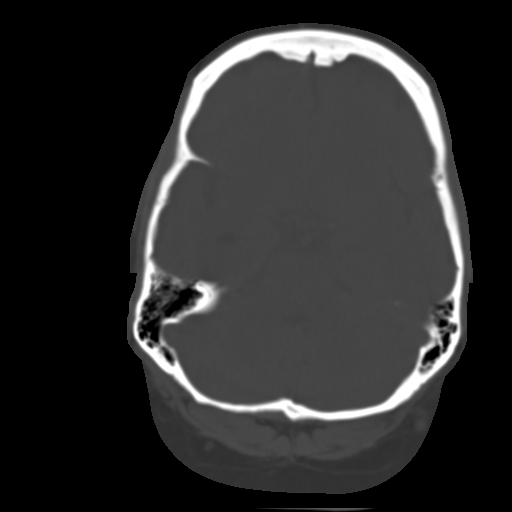
[im 10/28  brain]
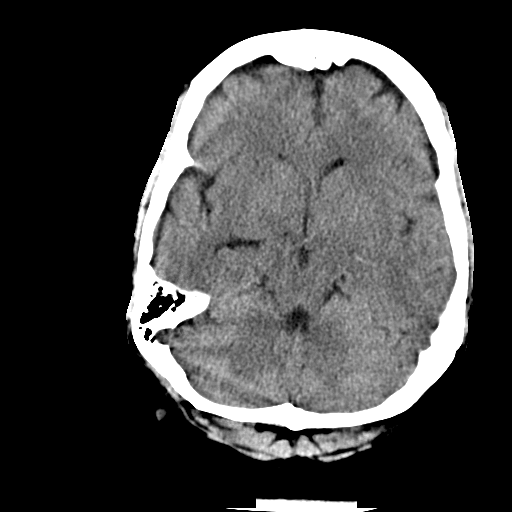
[im 12/28  brain]
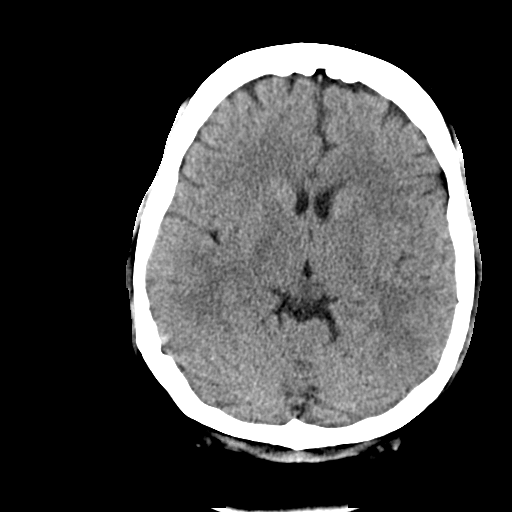
[im 14/28  brain]
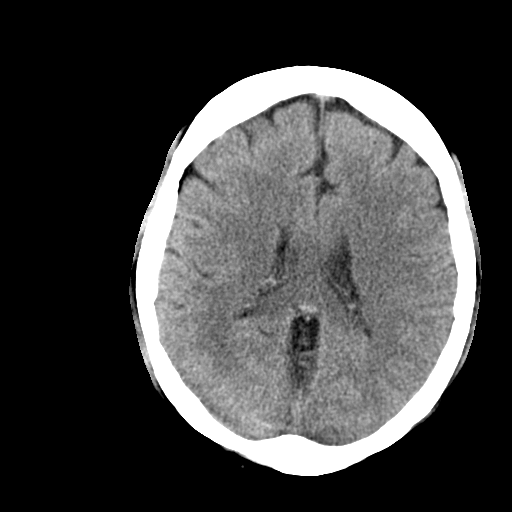
[im 15/28  brain]
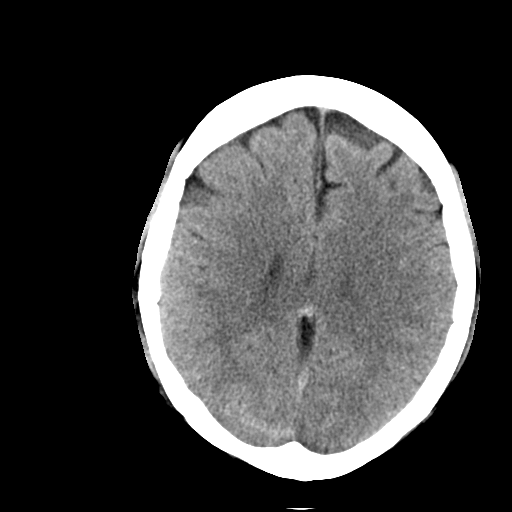
[im 15/28  bone]
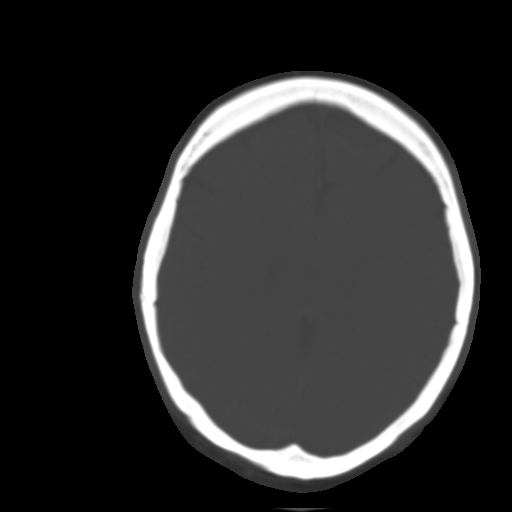
[im 17/28  brain]
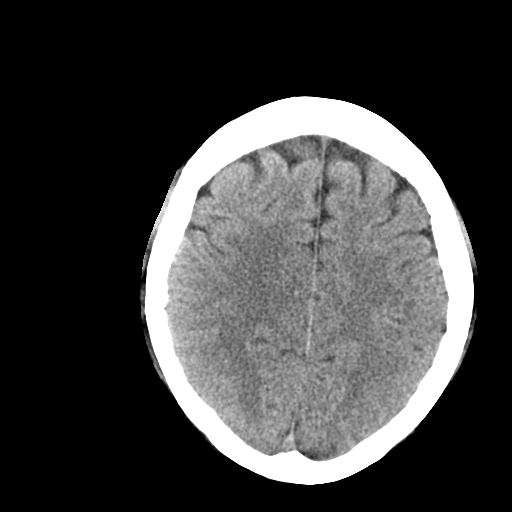
[im 19/28  brain]
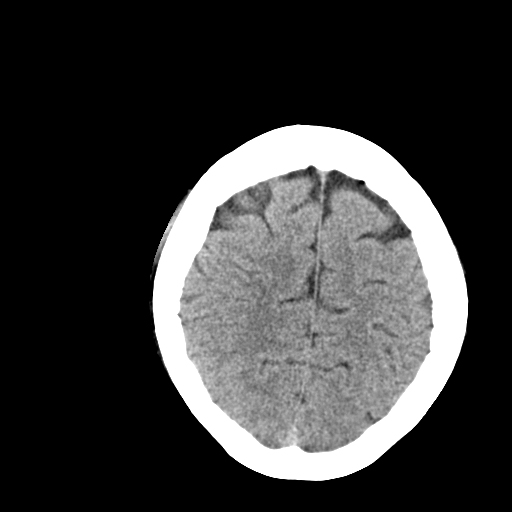
[im 20/28  brain]
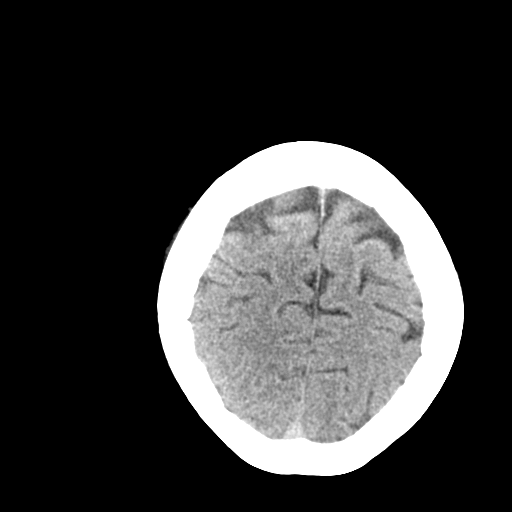
[im 22/28  brain]
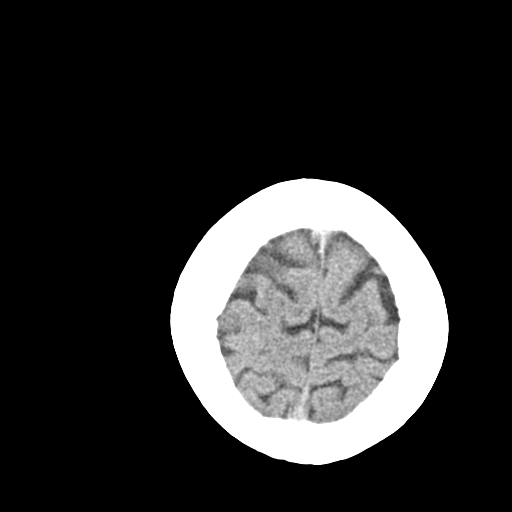
[im 22/28  bone]
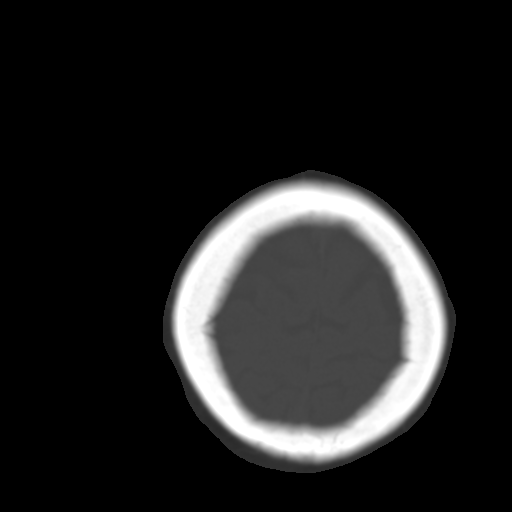
[im 23/28  brain]
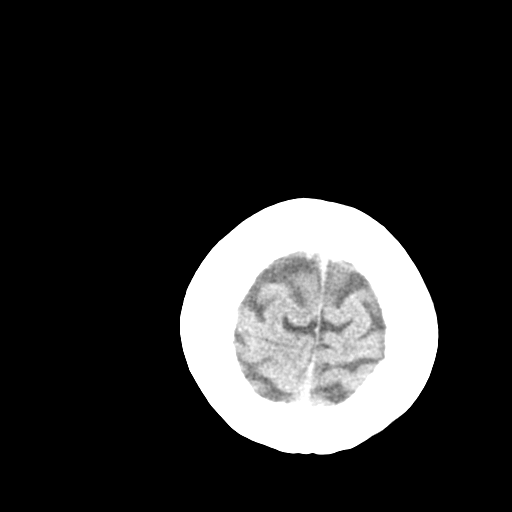
[im 25/28  brain]
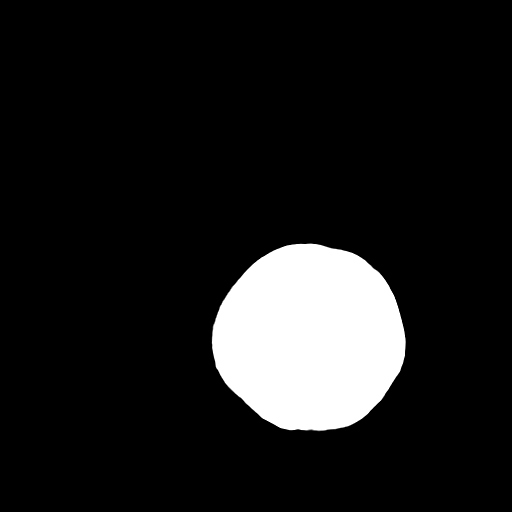
[im 27/28  brain]
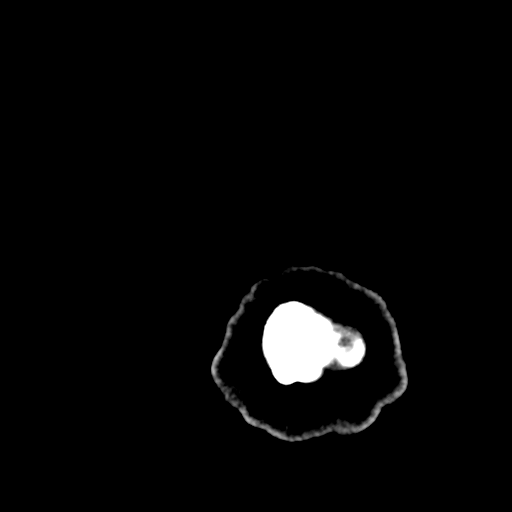

[16 of 28 positions shown; findings below may reference images not displayed]

EXAM

COMPUTED TOMOGRAPHY, HEAD OR BRAIN; WITHOUT CONTRAST MATERIAL; CPT 11051

INDICATION

facial numbness R
PT STATES HAS HAD PATCHY FACIAL NUMBNESS X 2 MONTHS. TODAY IS WORSE
W/ENTIRE RT SIDE OF FACE AND JAW NUMB. DENIES HEADACHE OR CHANGES TO
VISION. HB

TECHNIQUE

Noncontrast head CT was performed. All CT scans at this facility use dose modulation, iterative
reconstruction, and/or weight based dosing when appropriate to reduce radiation dose to as low as
reasonably achievable.

COMPARISONS

No prior studies are available for comparison.

FINDINGS

No evidence for acute infarct, mass, hemorrhage, or midline shift. No extra-axial fluid
collections. Gray/white matter differentiation is preserved. Ventricles appear normal.

Review of bone and soft tissue windows is unremarkable.

IMPRESSION

No acute intracranial abnormality.

## 2016-09-20 IMAGING — CR LOW_EXM
3 series · 3 of 3 positions shown · non-contrast
Comparison: none

[foot]
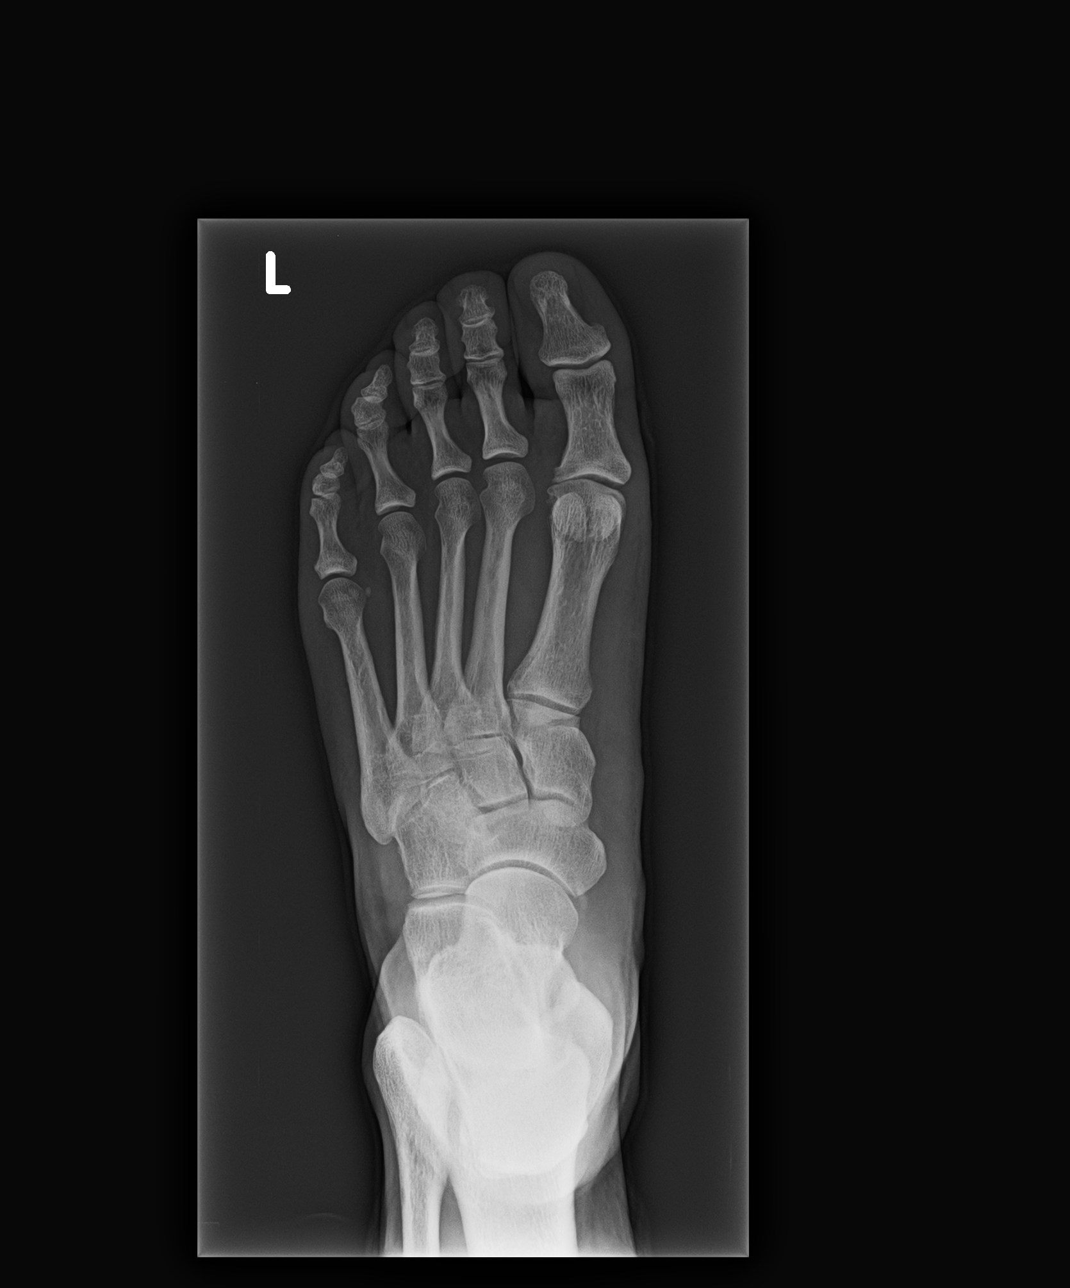

[foot obl]
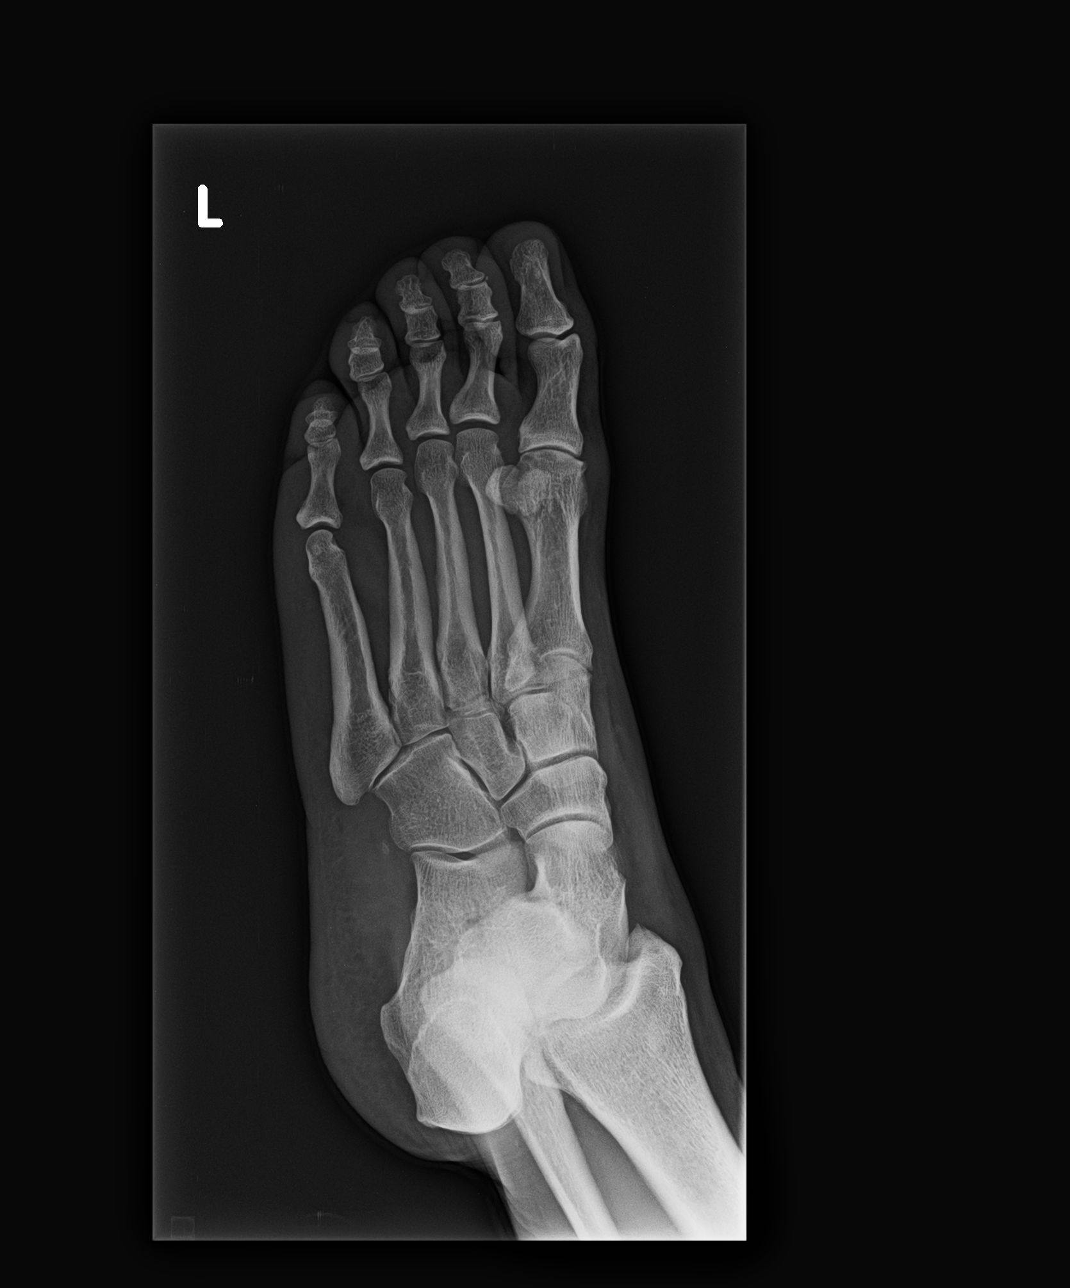

[foot lat]
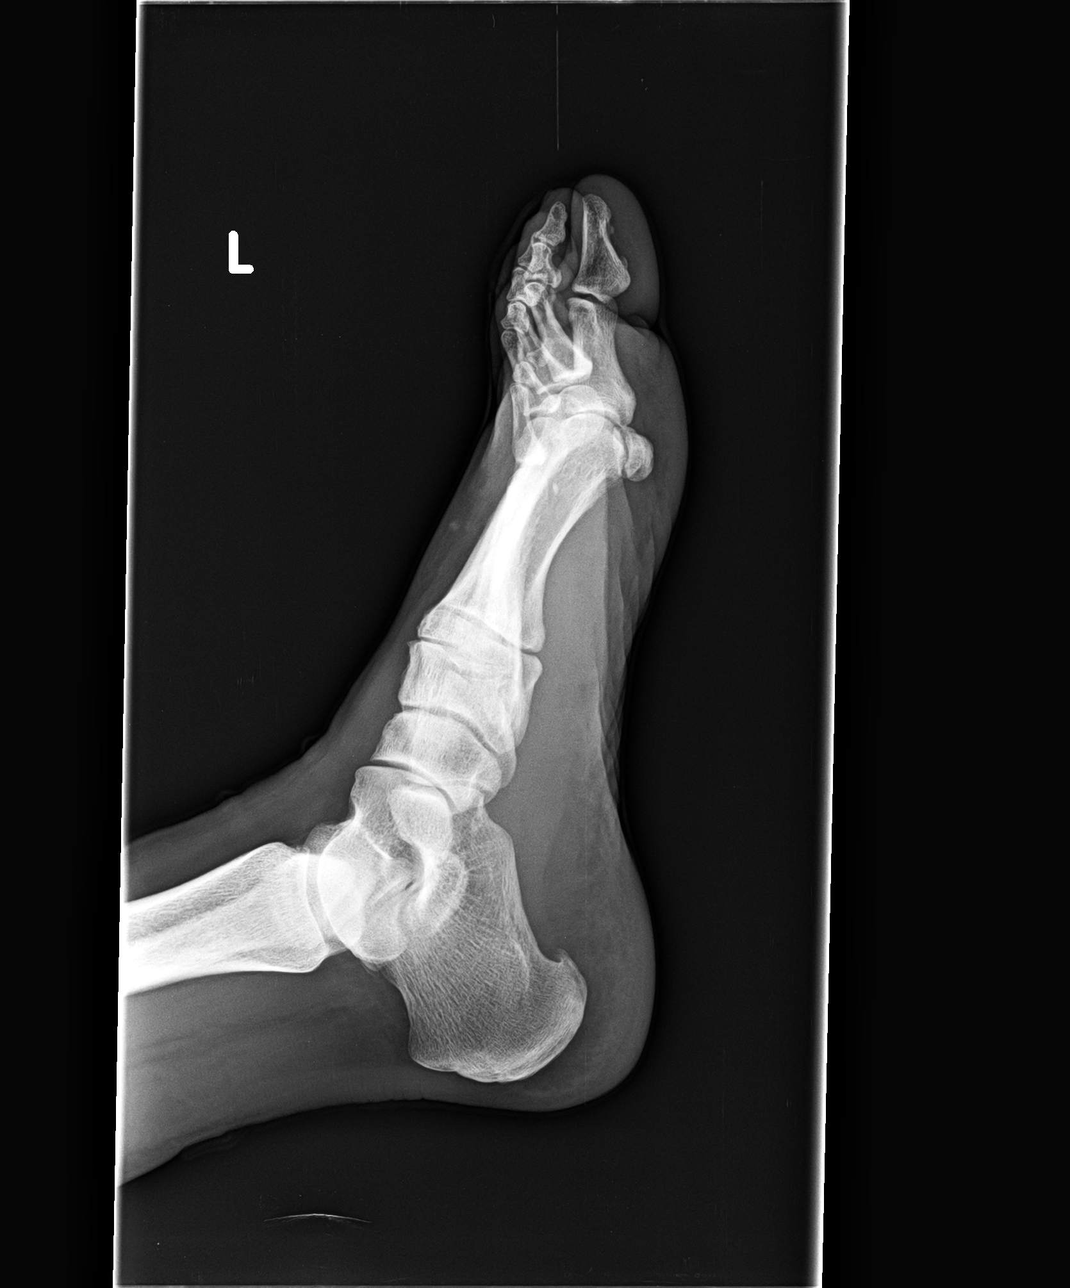

[3 of 3 positions shown; findings below may reference images not displayed]

EXAM

3 views left foot

INDICATION

Left foot pain
PT. C/O FOOT PAIN. HX: ARTHRITIS.

TECHNIQUE

AP, lateral, and oblique views were obtained

COMPARISONS

None available.

FINDINGS

There is mild hallux deformity of the 1st digit with minimal degenerative change. There is a small
heel spur. There is no acute fracture, dislocation, or other acute osseus abnormality. The soft
tissues are unremarkable.

IMPRESSION

1. Mild hallux deformity of the 1st digit without evidence of acute osseous abnormality.

## 2016-09-20 IMAGING — MG MAMMOGRAM, DIGITAL SCREEN BILA
1 series · 5 of 5 positions shown · non-contrast
Comparison: none

[Series 2: R CC · right · 5 of 5 slices shown]
[im 1/5]
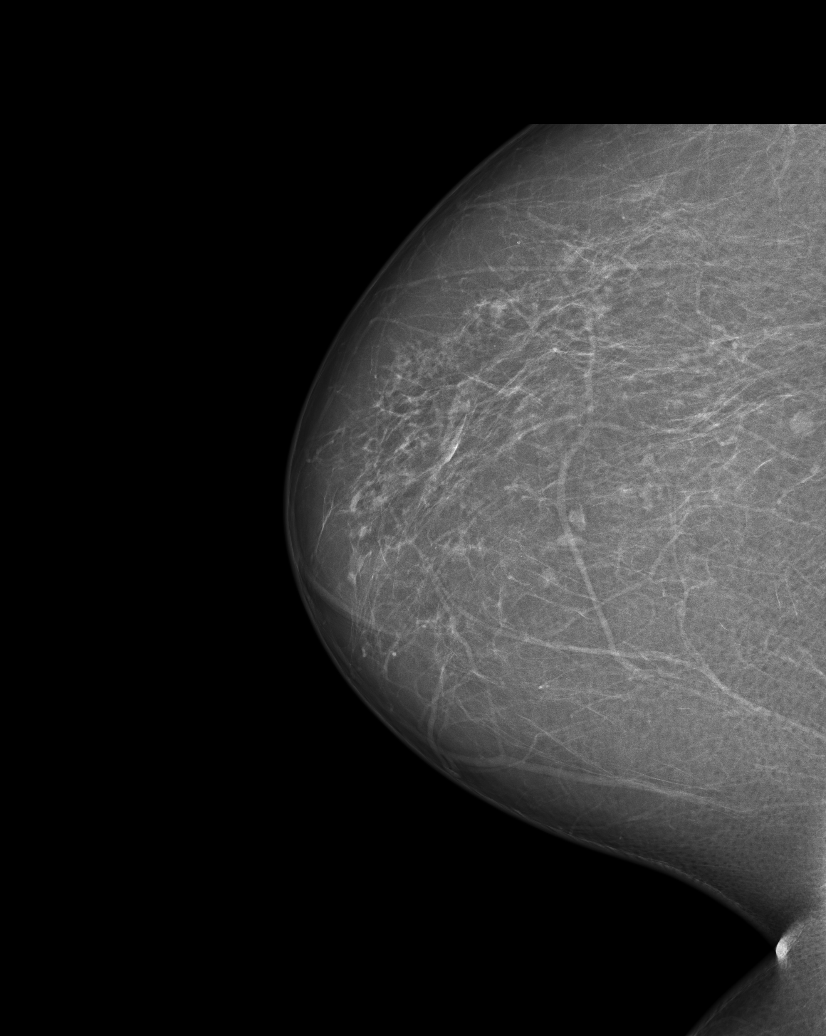
[im 2/5]
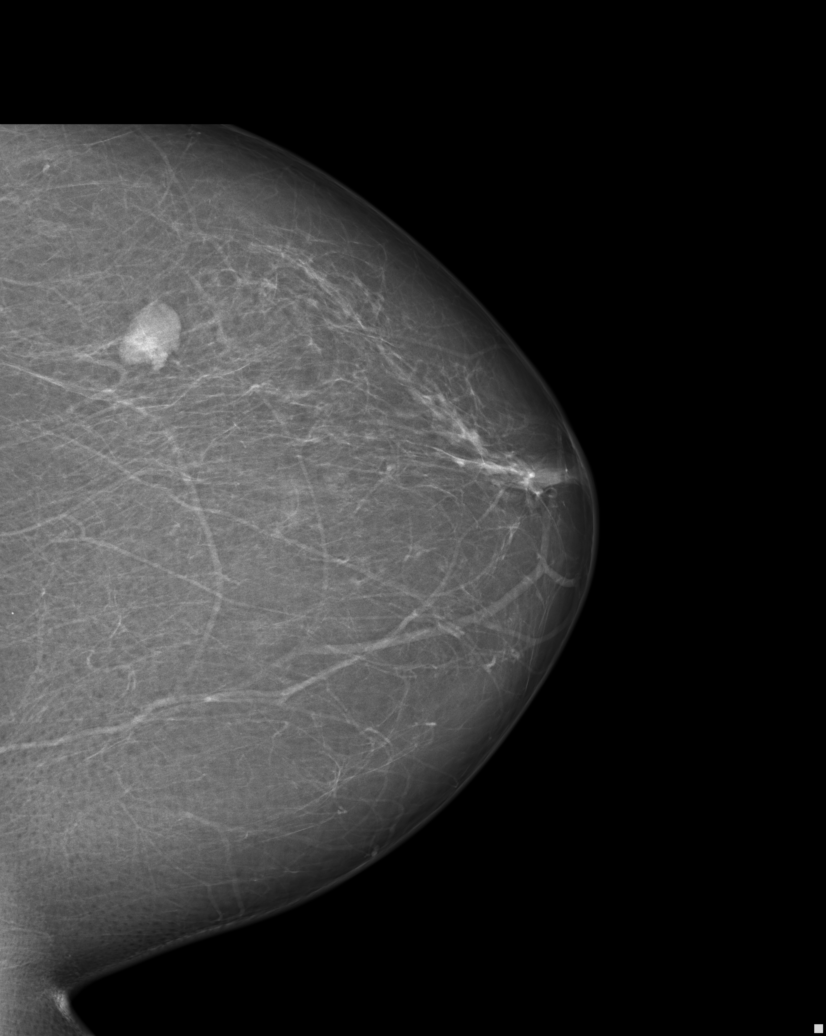
[im 3/5]
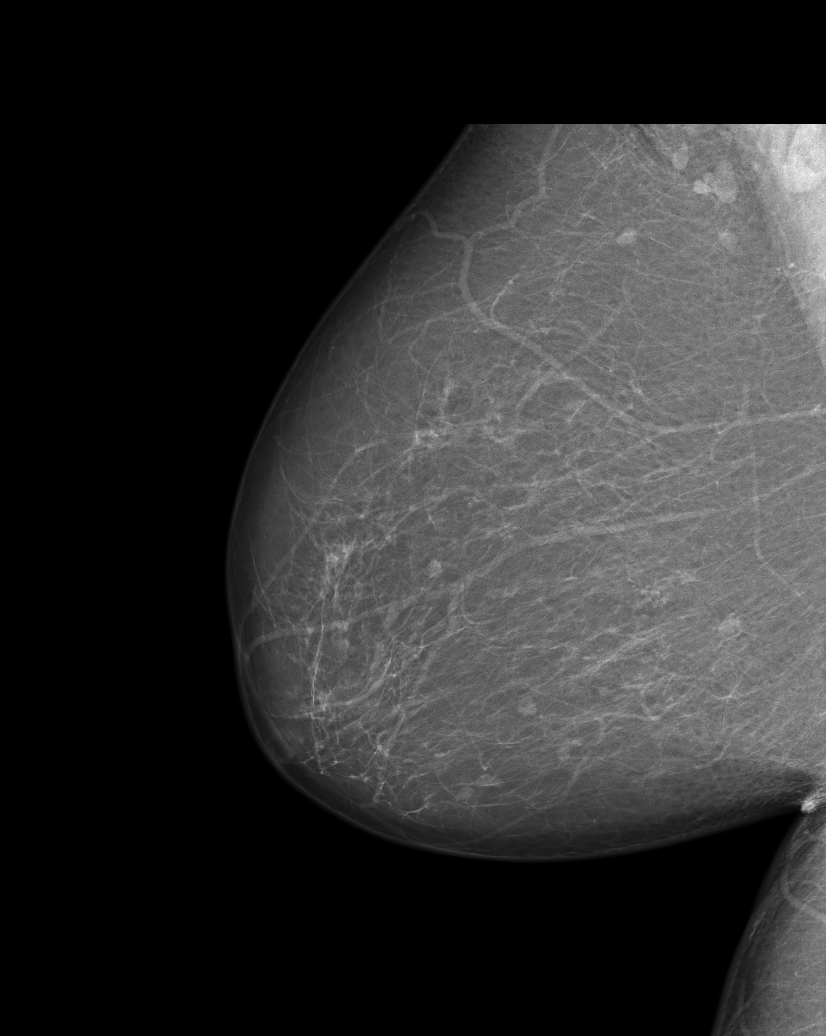
[im 4/5]
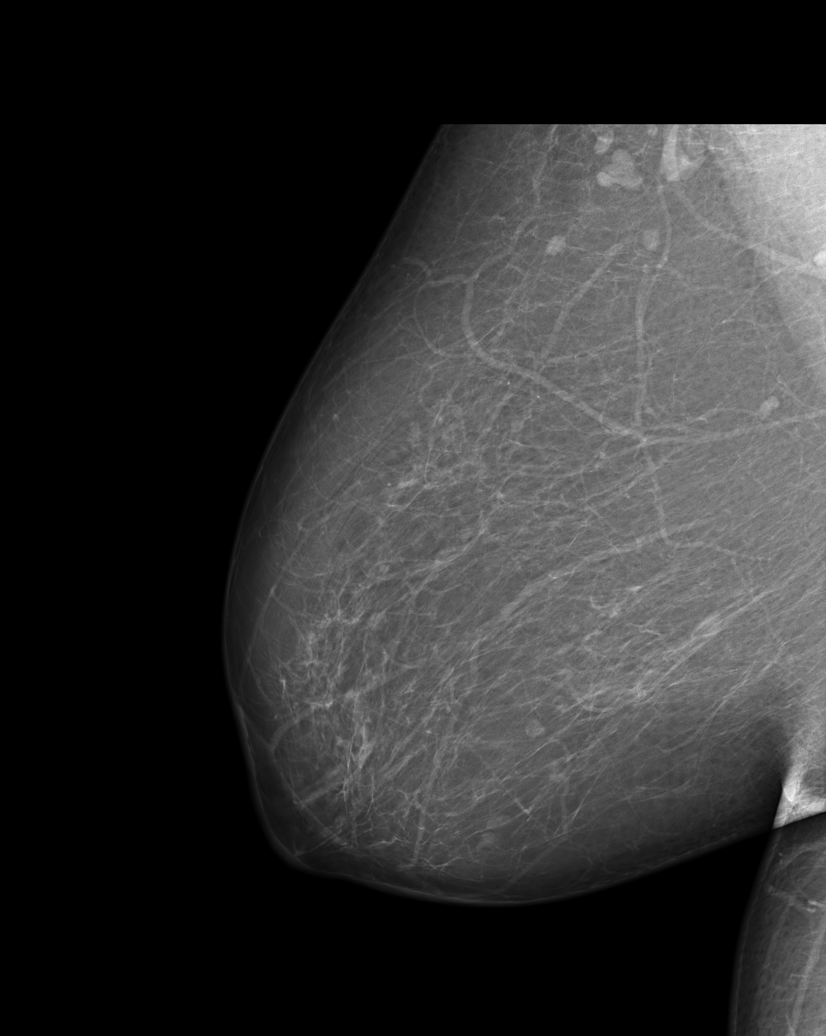
[im 5/5]
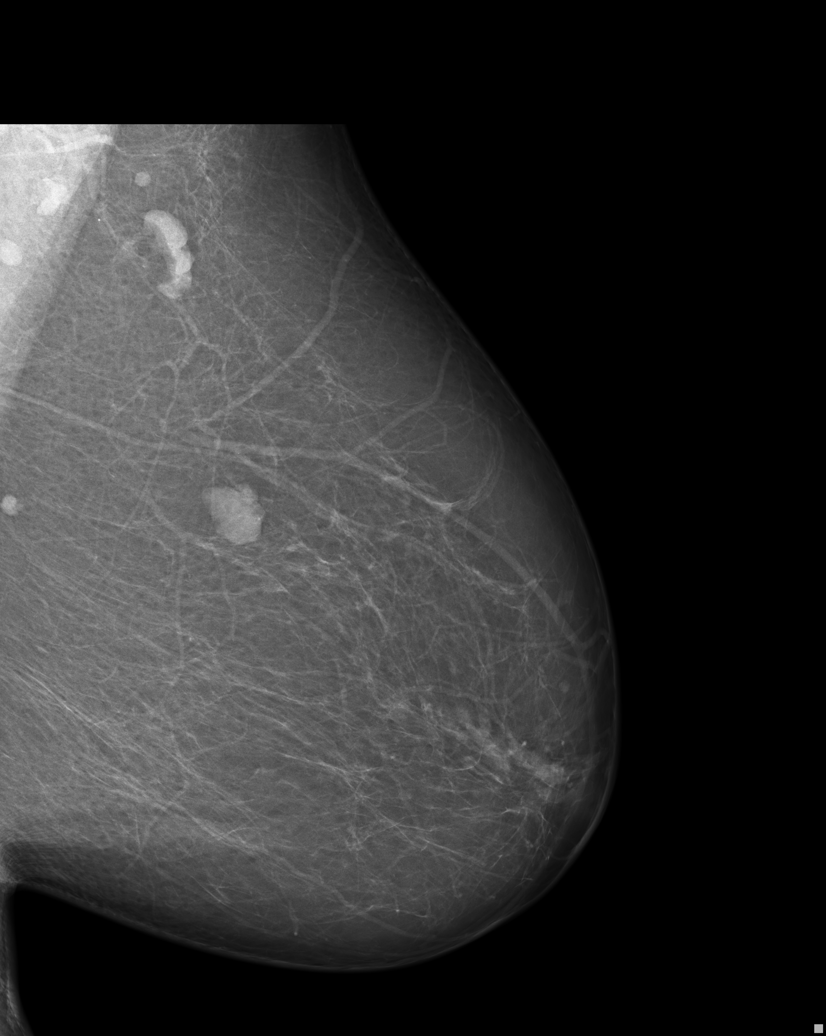

[5 of 5 positions shown; findings below may reference images not displayed]

DIAGNOSTIC STUDIES

EXAM

Screening mammogram

INDICATION

screen
CYST ASPIRATION 9112, BENIGN. NULLIPARITY. SCREENING. AB ([id number]
PREFERS) PRIORS: 9112, 0722.

TECHNIQUE

Digital craniocaudal mediolateral oblique images of the breasts were obtained and reviewed with
computer-aided detection.

COMPARISONS

Mammogram dated August 26, 2015

FINDINGS

Breasts again demonstrate a scattered to fatty pattern. There are multiple benign appearing
calcifications seen in the bilateral breasts. Within the upper outer left breast there is again
seen a circumscribed density likely representing a small cyst. There is no suspicious calcification
, architectural distortion, or dominant mass.

IMPRESSION

BI-RADS 2, benign findings.

Recommend continued yearly screening and a follow-up letter will be scheduled.

## 2016-09-20 IMAGING — CR UP_EXM
3 series · 3 of 3 positions shown · non-contrast
Comparison: none

[hand]
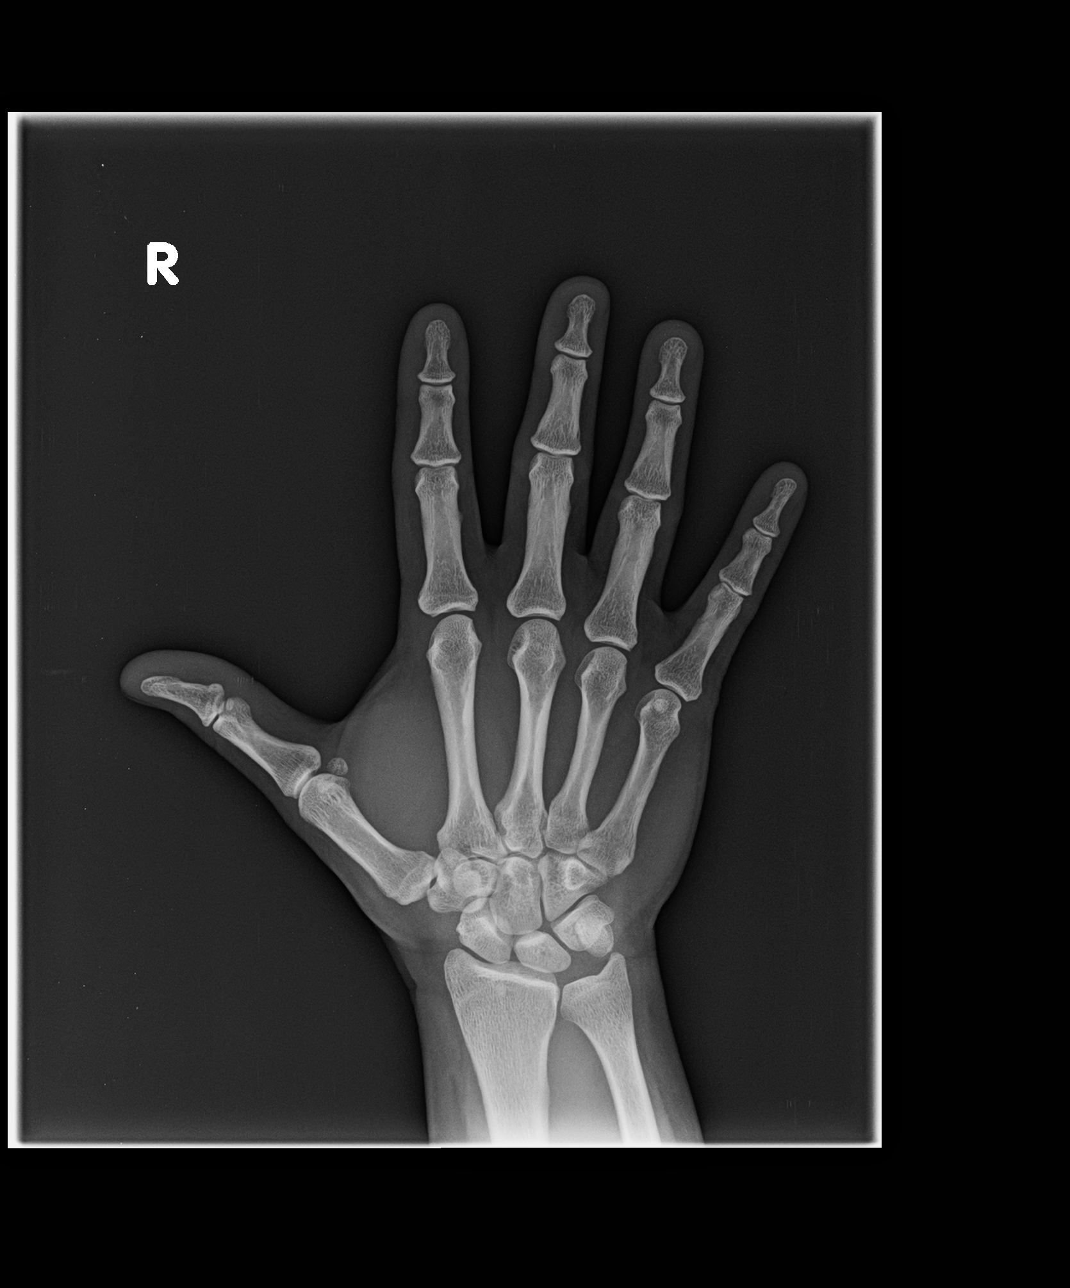

[hand obl]
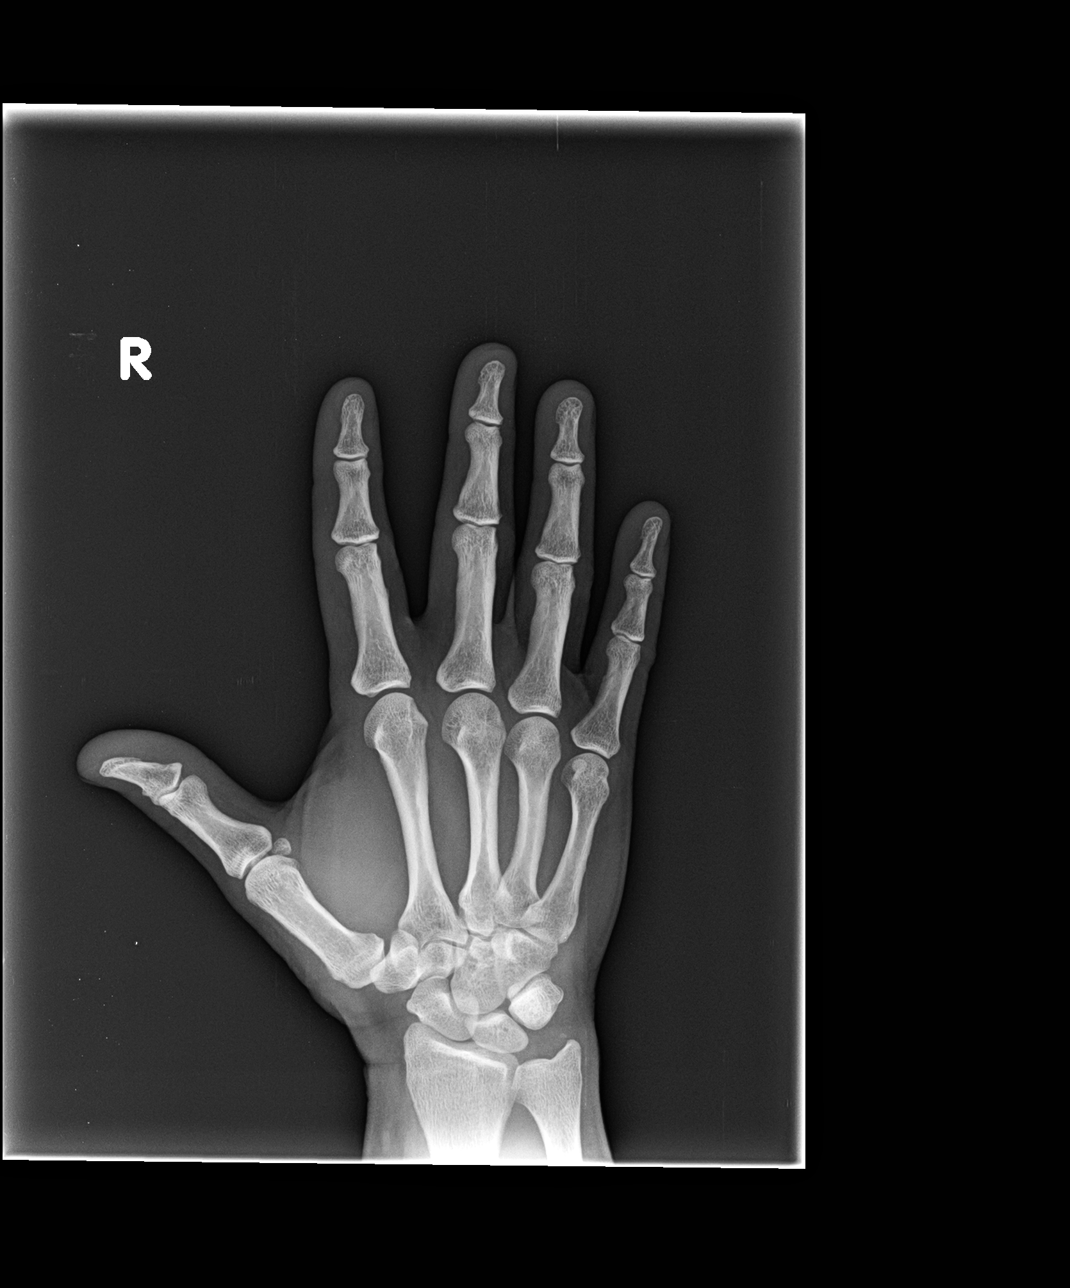

[hand lat]
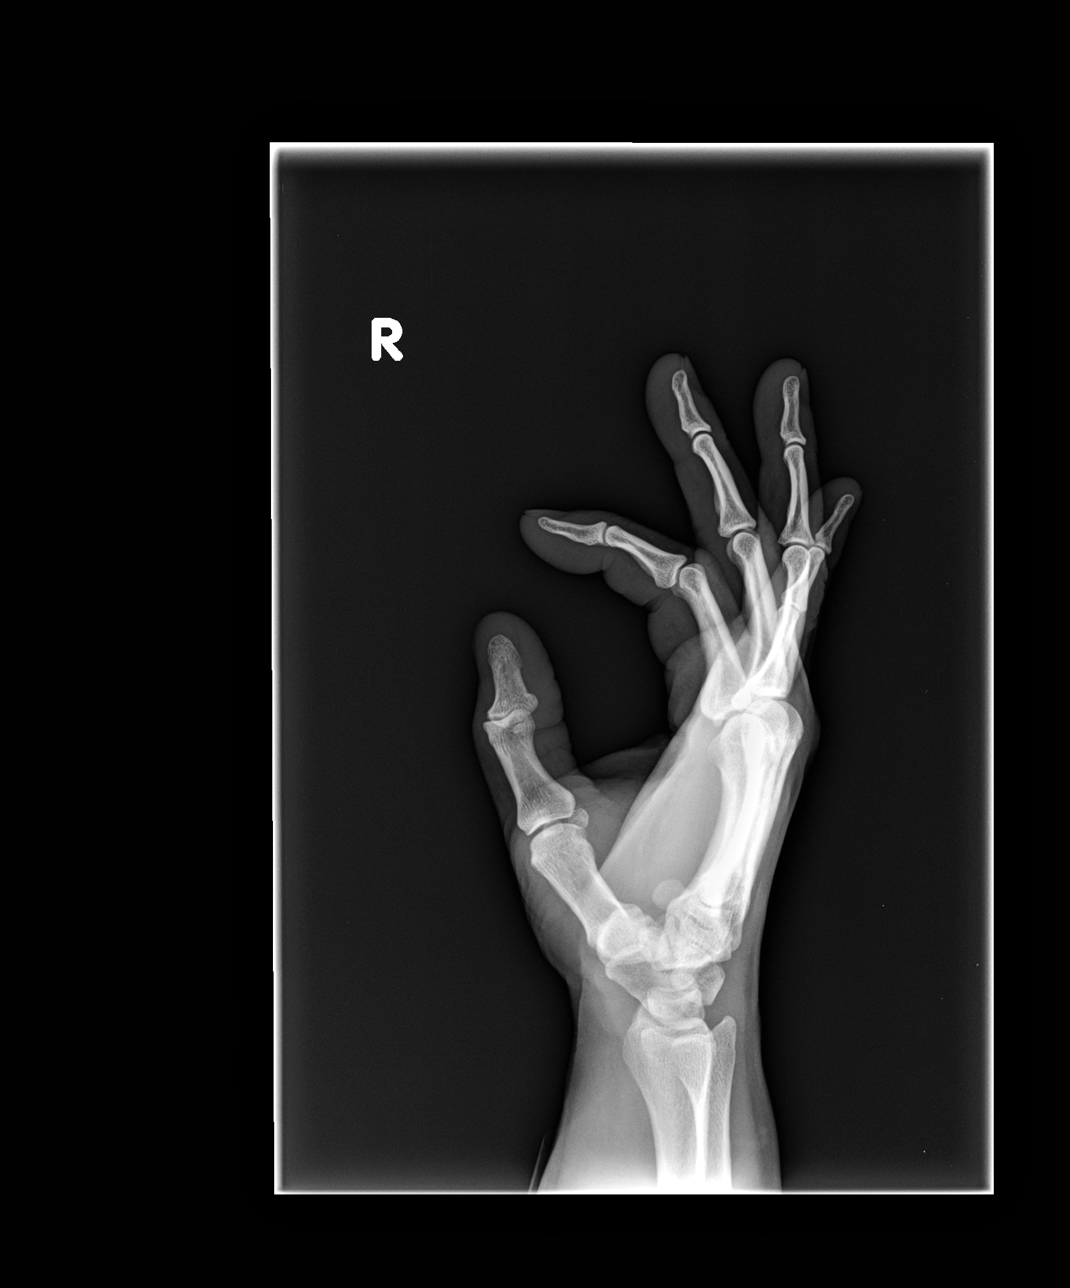

[3 of 3 positions shown; findings below may reference images not displayed]

EXAM

3 views right hand

INDICATION

Right hand pain
PT. C/O BILATERAL HAND PAIN. PAIN IN DIP JOINTS.

TECHNIQUE

AP, lateral, and oblique views were obtained

COMPARISONS

None available.

FINDINGS

There is minimal loss of joint space at the proximal interphalangeal joints without evidence of
marginal osteophyte formation or subchondral sclerosis. There is no acute fracture, dislocation, or
other acute osseus abnormality. The soft tissues are unremarkable.

IMPRESSION

1. Minimal loss of joint space at the proximal interphalangeal joints without evidence of bony
erosion, marginal osteophyte formation or subluxation.

## 2016-09-20 IMAGING — CR UP_EXM
3 series · 3 of 3 positions shown · non-contrast
Comparison: none

[hand]
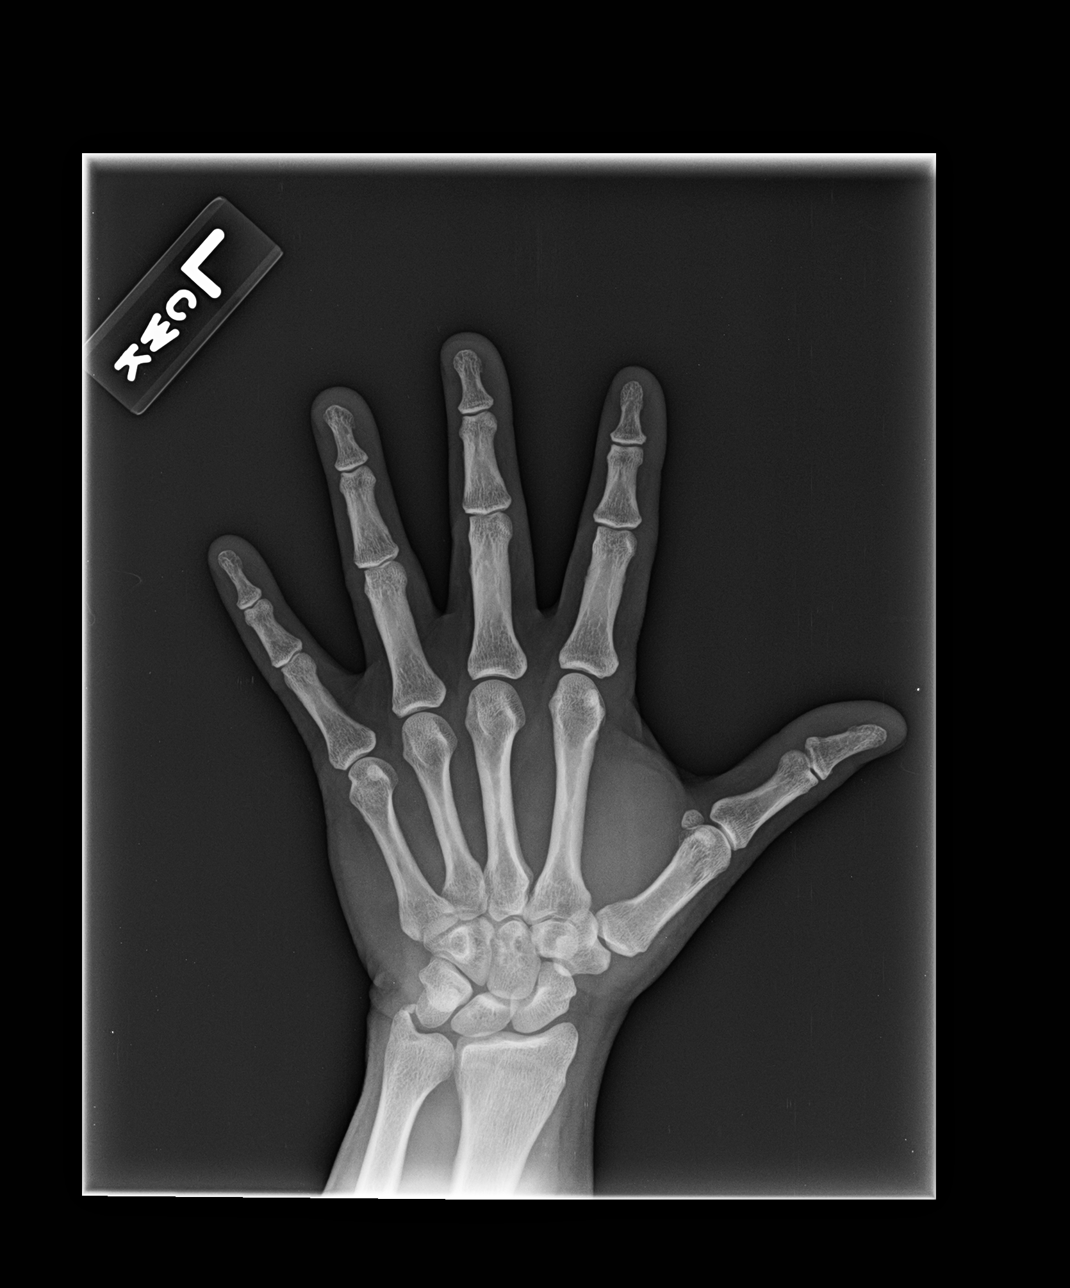

[hand obl]
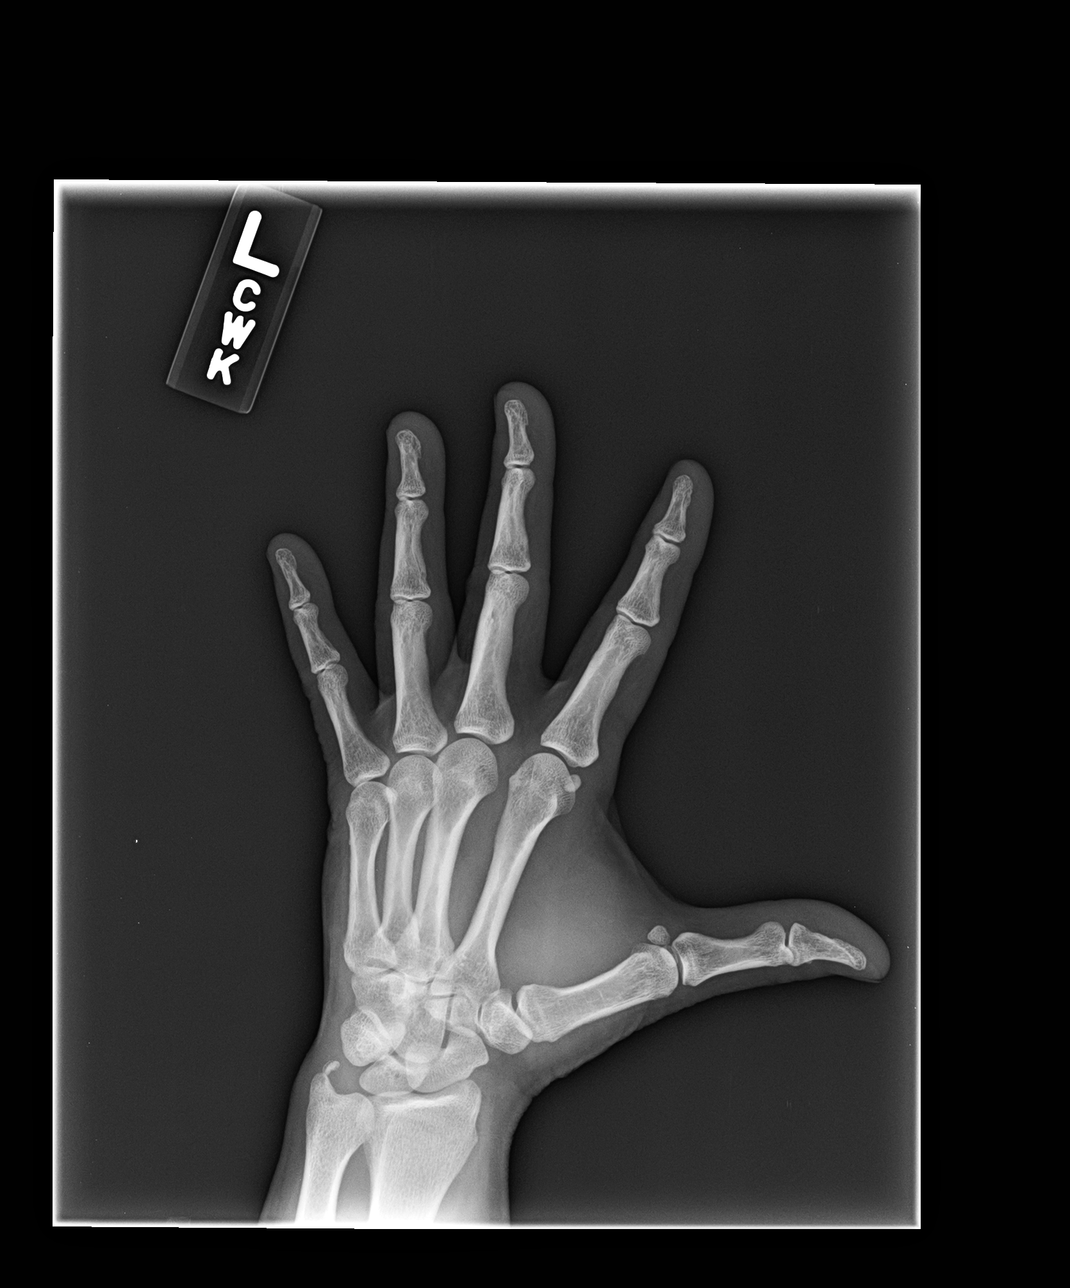

[hand lat]
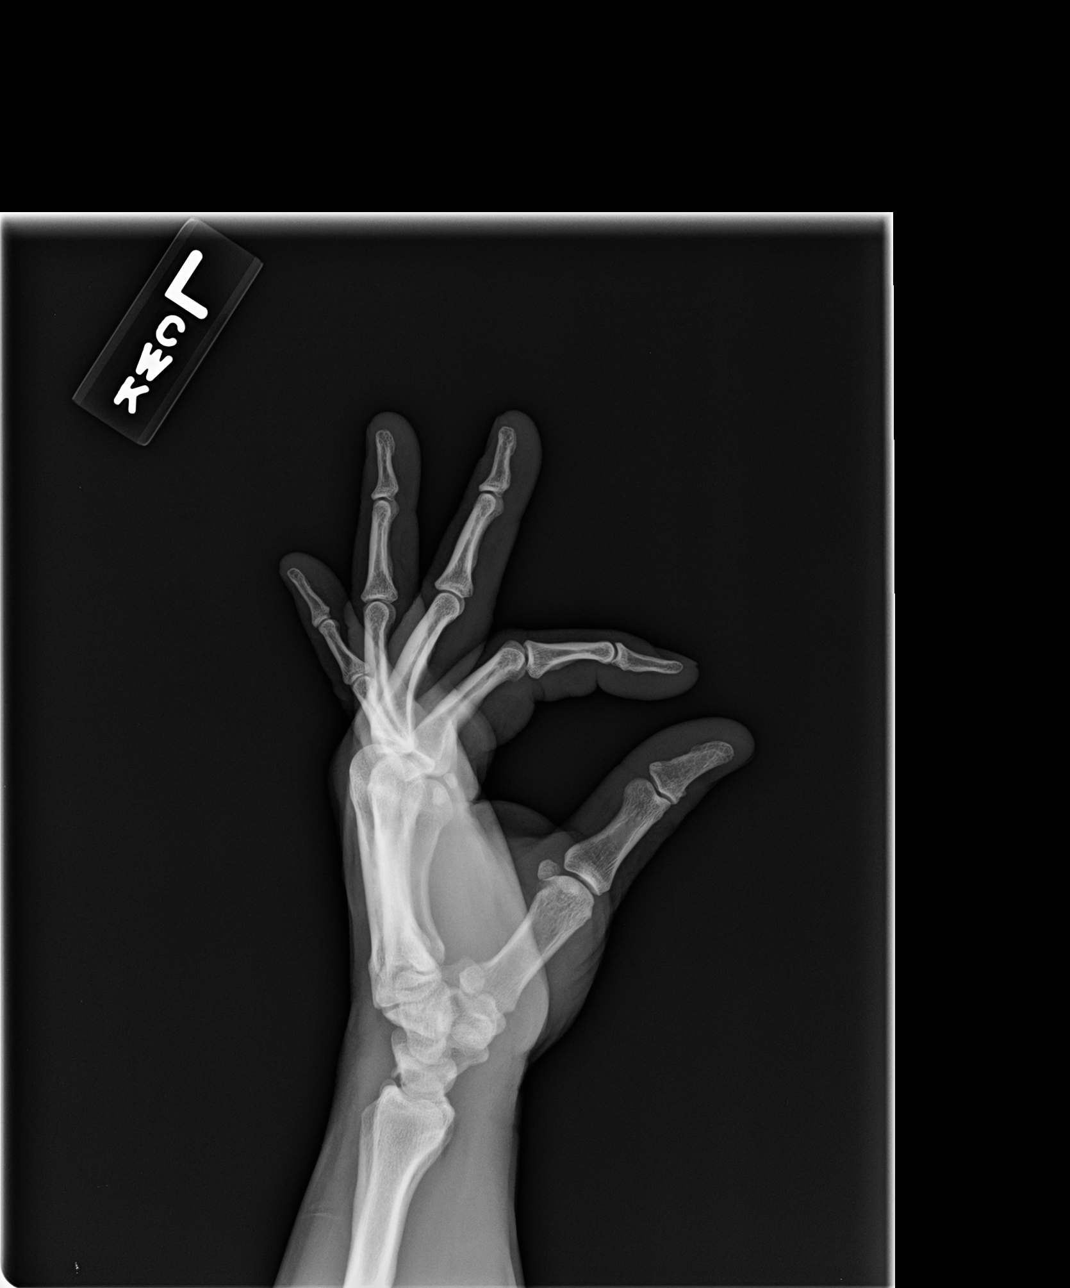

[3 of 3 positions shown; findings below may reference images not displayed]

EXAM

3 views left hand

INDICATION

Left hand pain
PT. C/O BILATERAL HAND PAIN. PAIN IN DIP JOINTS.

TECHNIQUE

AP, lateral, and oblique views were obtained

COMPARISONS

None available.

FINDINGS

There is no significant joint space narrowing or marginal osteophyte formation of the distal or
proximal interphalangeal joints. There is no acute fracture, dislocation, or other acute osseus
abnormality. The soft tissues are unremarkable.

IMPRESSION

1. No significant degenerative changes at the distal interphalangeal joints. No acute osseous
abnormality..

## 2016-11-28 IMAGING — CT Abdomen^1_ABDOMEN_PELVIS_WITH (Adult)
1 series · 15 of 32 positions shown, 19 images · IV contrast (APPLIED)
Comparison: none

[Series 2: abd/pelvis with 5.0 soft tissue · axial · 0.83mm/px · z∈[-440,-100]mm · 15 of 75 slices shown, 19 images]
[im 5/75  soft-tissue]
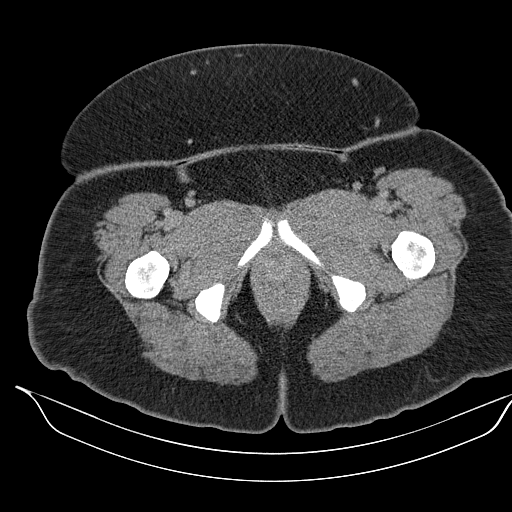
[im 5/75  bone]
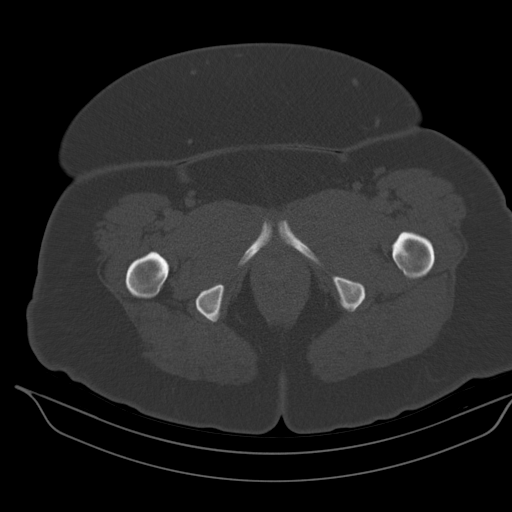
[im 10/75  soft-tissue]
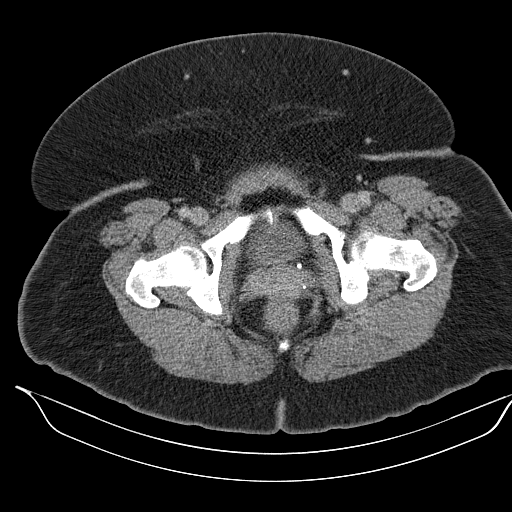
[im 15/75  soft-tissue]
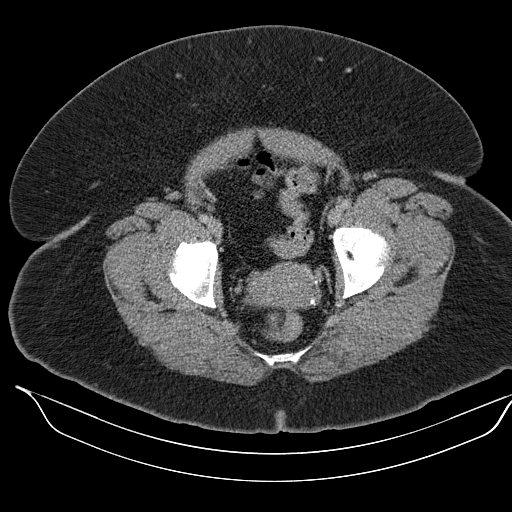
[im 22/75  soft-tissue]
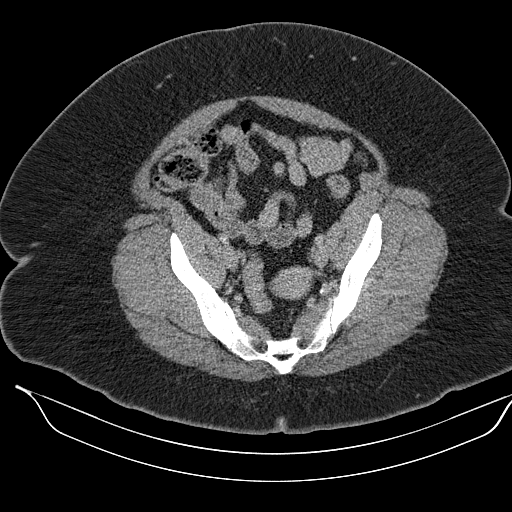
[im 27/75  soft-tissue]
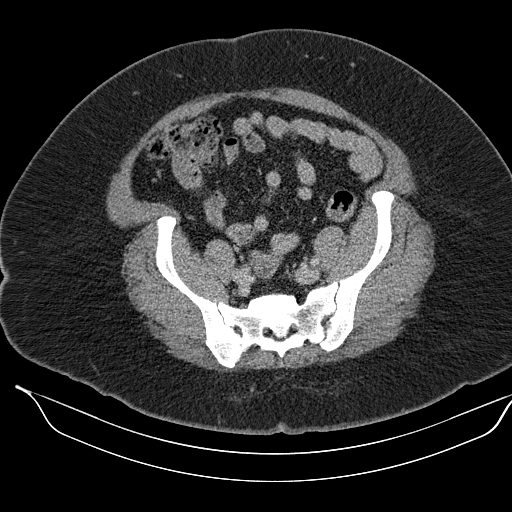
[im 32/75  soft-tissue]
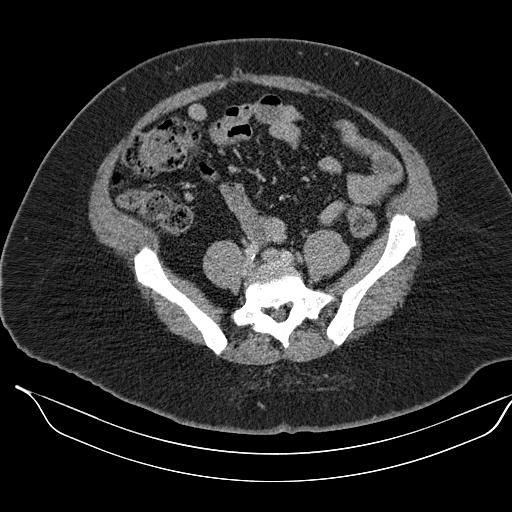
[im 39/75  soft-tissue]
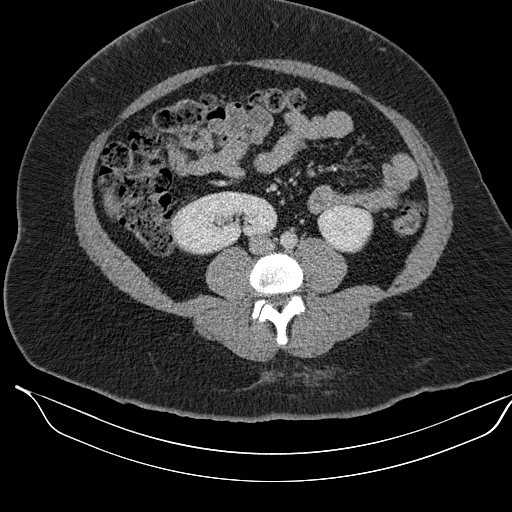
[im 43/75  soft-tissue]
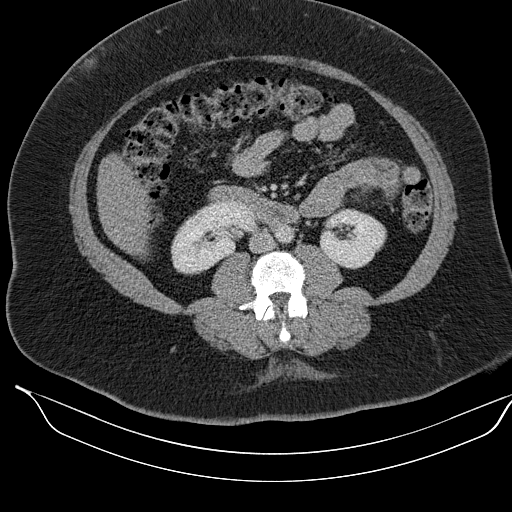
[im 48/75  soft-tissue]
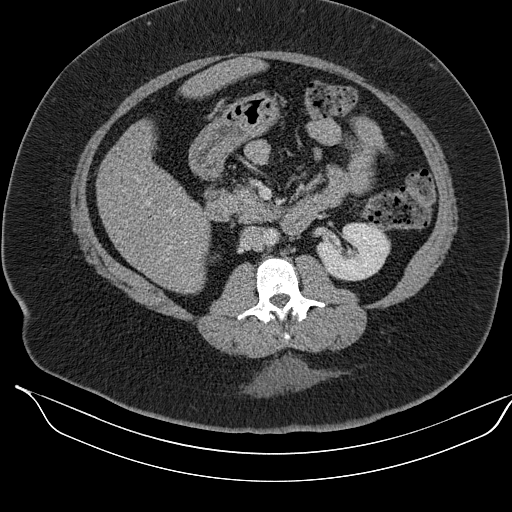
[im 48/75  bone]
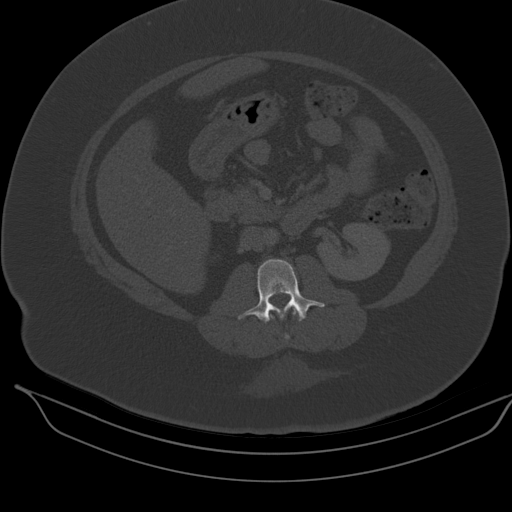
[im 53/75  soft-tissue]
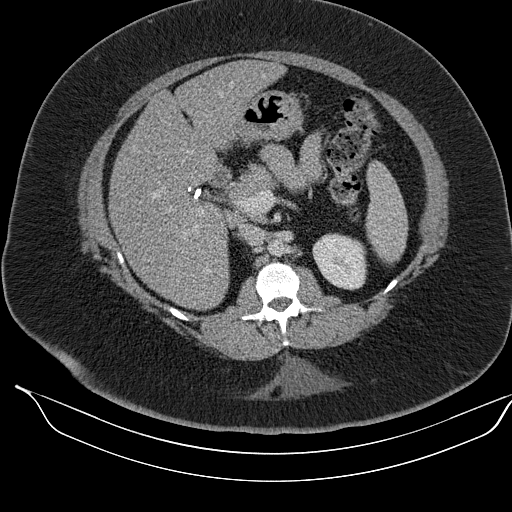
[im 60/75  soft-tissue]
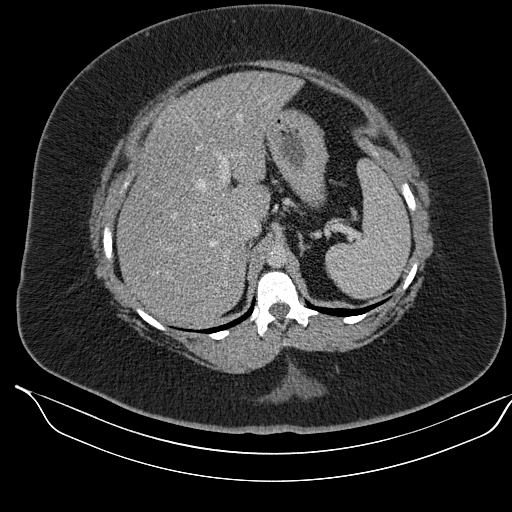
[im 65/75  soft-tissue]
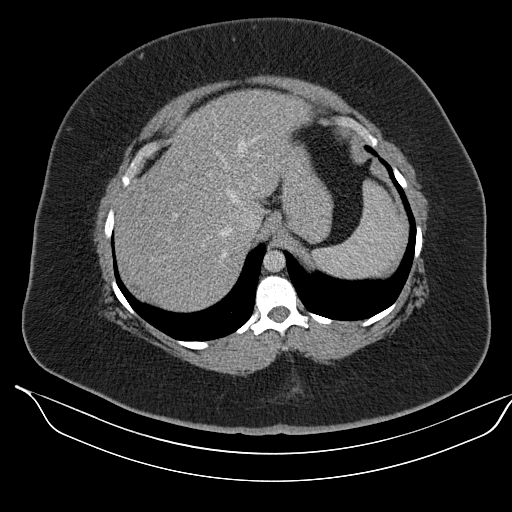
[im 65/75  lung]
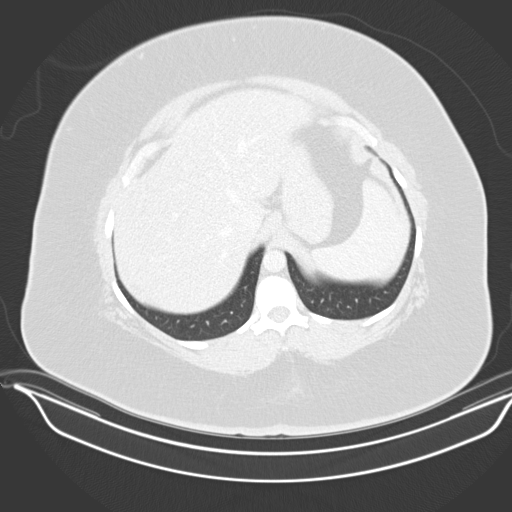
[im 67/75  lung]
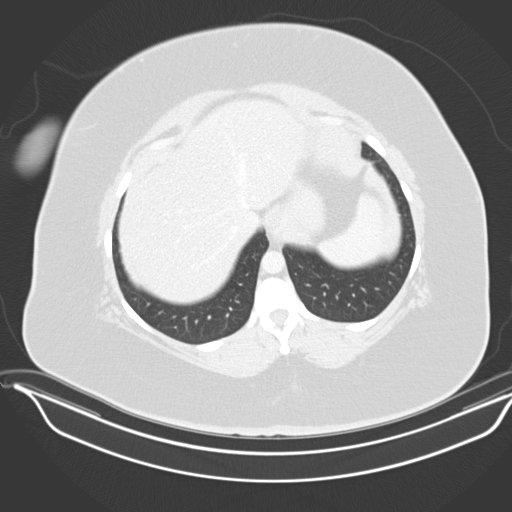
[im 70/75  soft-tissue]
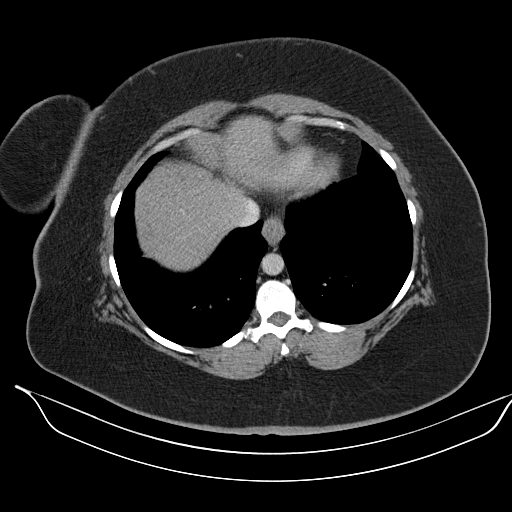
[im 70/75  lung]
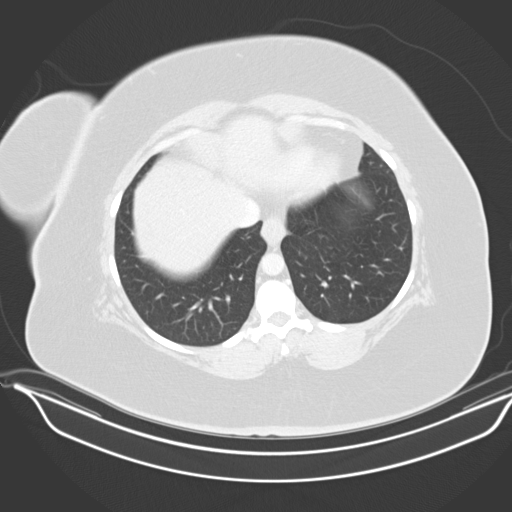
[im 72/75  lung]
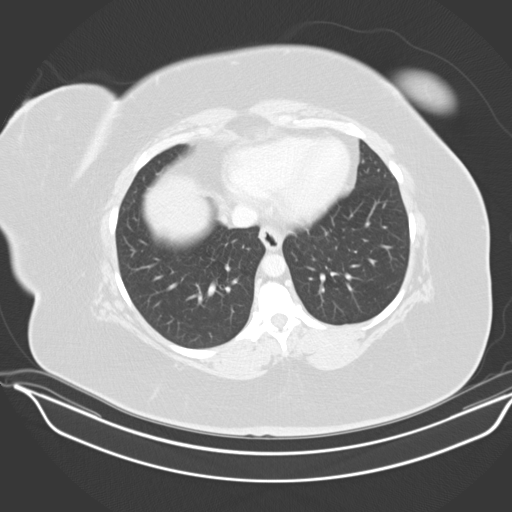

[15 of 32 positions shown; findings below may reference images not displayed]

EXAM

COMPUTED TOMOGRAPHY, ABDOMEN AND PELVIS; WITHOUT CONTRAST MATERIAL CPT 76378

INDICATION

HEMATURIA
PT C/O HEMATURIA. HX OF DIABETES. CREAT. 0.8. [id number]. TJ

TECHNIQUE

Noncontrast CT of the abdomen and pelvis was performed with 2.5mm axial images.

All CT scans at this facility use dose modulation, iterative reconstruction, and/or weight based
dosing when appropriate to reduce radiation dose to as low as reasonably achievable.

COMPARISONS

There are no previous examinations available for comparison at the time of dictation.

FINDINGS

Lung bases appear clear. No pleural or pericardial effusion.

ABDOMEN: The liver, spleen, pancreas, adrenal glands and kidneys are normal in size and shape.
There is no evidence of intra or extrahepatic biliary ductal dilatation. The gallbladder is
surgically absent.

The right kidney is malrotated. There is no evidence of nephrolithiasis or ureterolithiasis. There
is no evidence of hydronephrosis or obstructive uropathy. There is no evidence of epigastric or
periportal adenopathy.

The aorta and inferior vena cava are normal in caliber and contour. No evidence of mesenteric
adenopathy was noted either.

PELVIS: Visualized loops of small and large bowel are normal in caliber and contour. The appendix
is normal. There is no evidence of retoperitoneal, inguinal or pelvic sidewall adenopathy. The
uterus and ovaries appear within normal limits. There is no evidence of free fluid or free
intraperitoneal air.

The bony structures appear adequately mineralized.

IMPRESSION

1. No renal, ureteral, or bladder calculi. No obstructing uropathy. The right kidney is
malrotated.

2. Otherwise unremarkable unenhanced CT of the abdomen and pelvis.

## 2016-12-03 LAB — COMPREHENSIVE METABOLIC PANEL
Lab: 0.5
Lab: 142
Lab: 16 — ABNORMAL HIGH (ref 0–14)
Lab: 23
Lab: 3.8
Lab: 39
Lab: 7.2
Lab: 81

## 2016-12-03 LAB — LIPID PROFILE
Lab: 143 — ABNORMAL LOW (ref 150–200)
Lab: 4
Lab: 87 — ABNORMAL HIGH (ref 7.0–18.7)

## 2016-12-03 LAB — THYROID STIMULATING HORMONE-TSH: Lab: 1.6

## 2016-12-03 LAB — HEMOGLOBIN A1C: Lab: 7 — ABNORMAL HIGH (ref 4.5–6.5)

## 2017-01-04 IMAGING — CR LOW_EXM
2 series · 2 of 2 positions shown · non-contrast
Comparison: none

[knee ap]
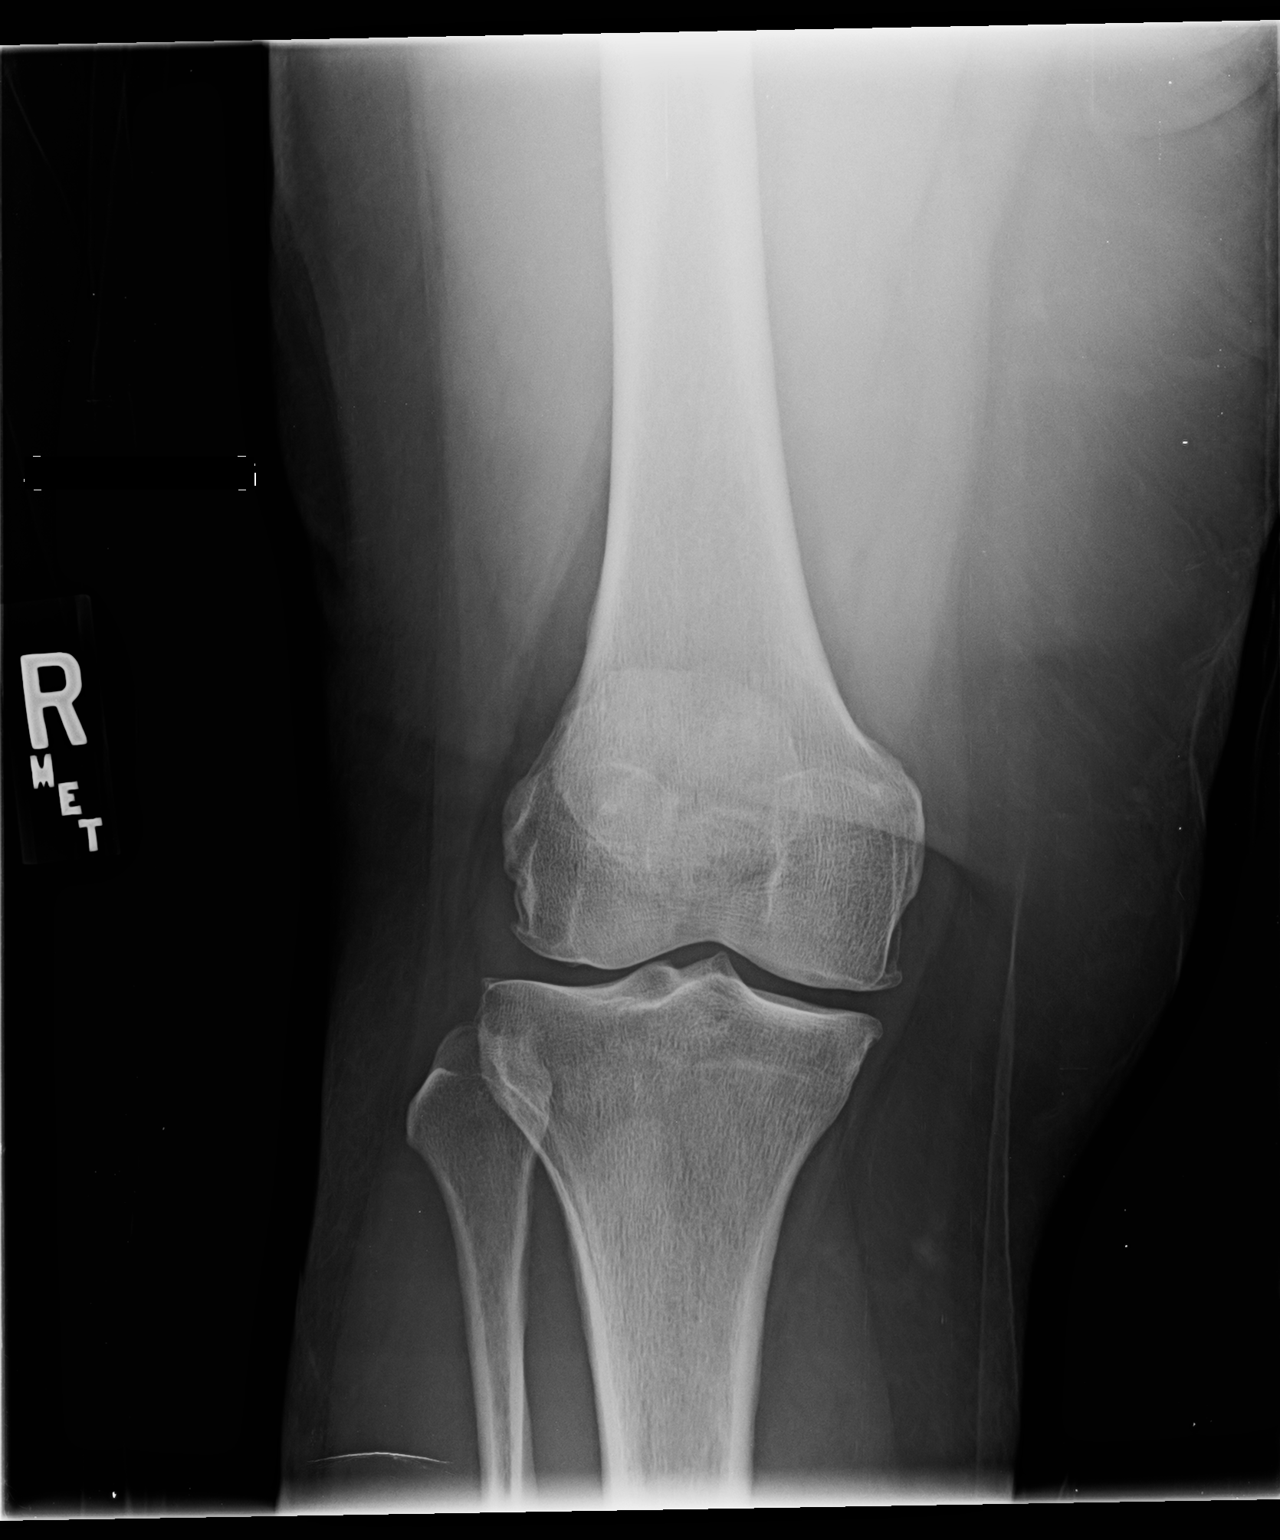

[knee lat]
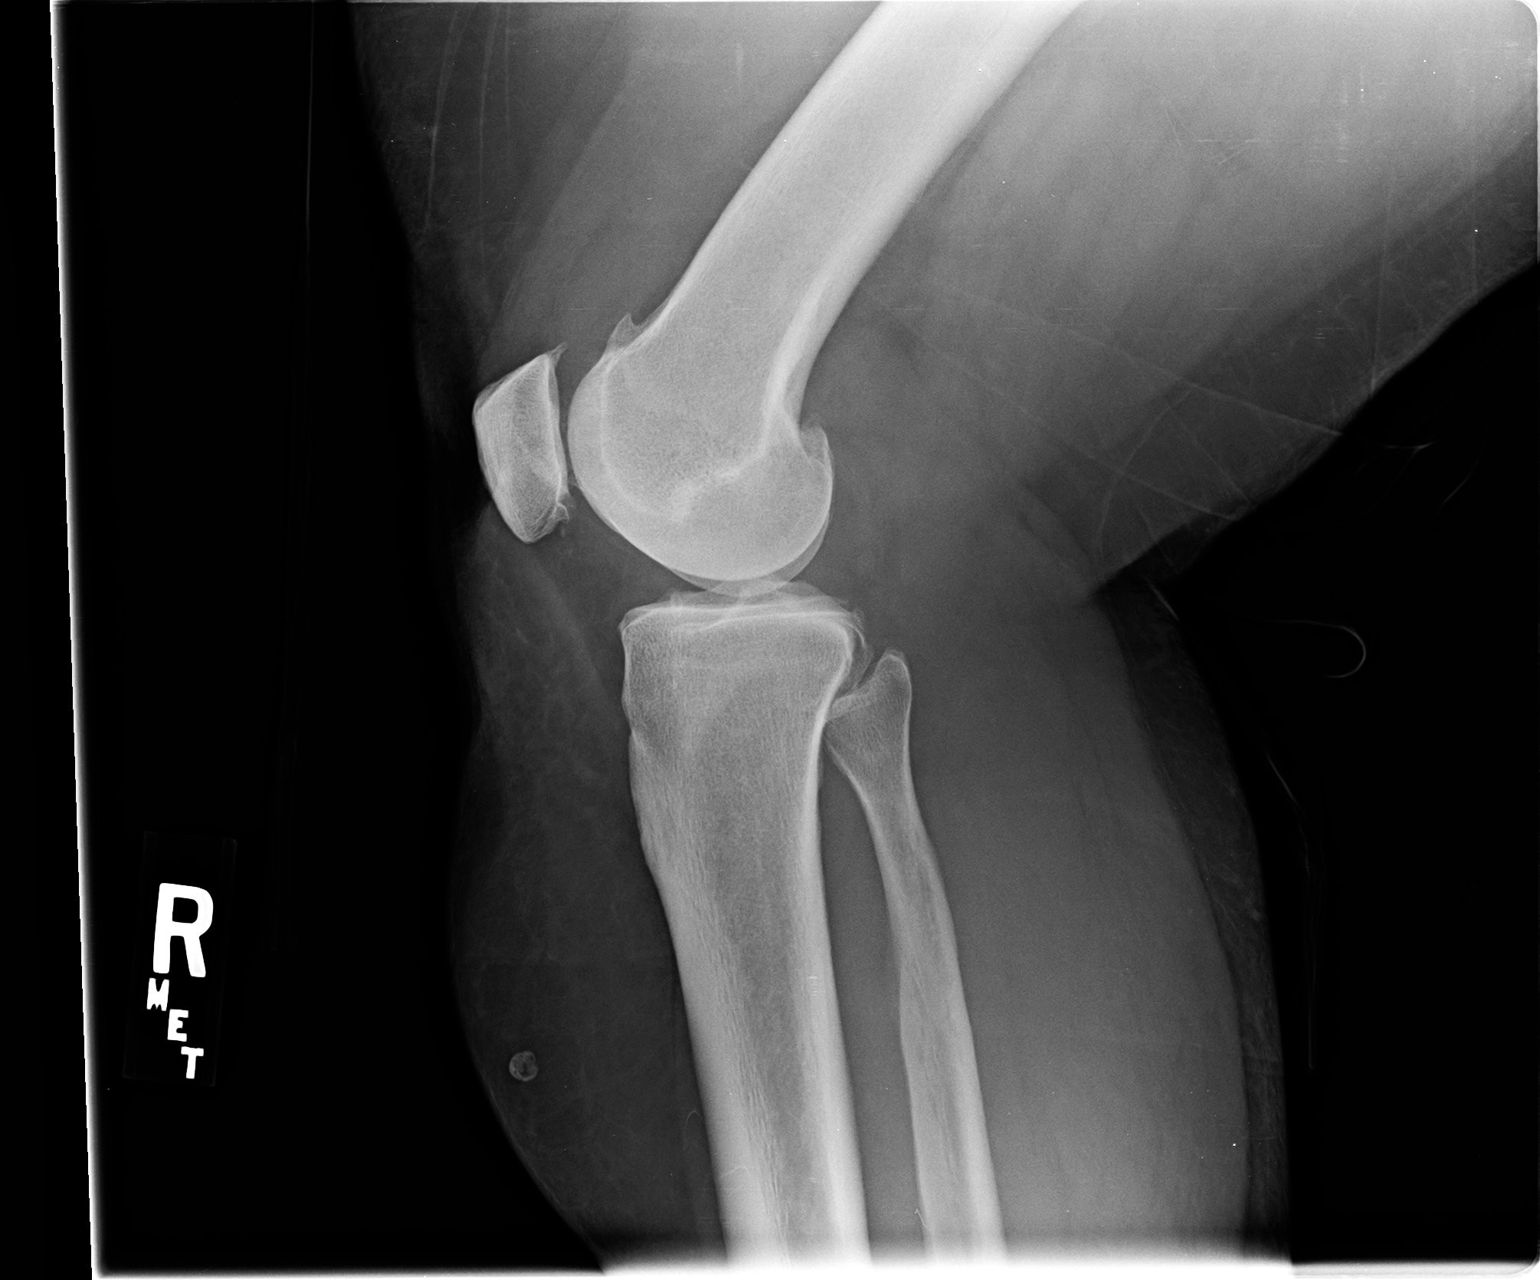

[2 of 2 positions shown; findings below may reference images not displayed]

DIAGNOSTIC STUDIES

EXAM

Right knee radiograph

INDICATION

Right knee pain
PT. RIGHT KNEE PAIN. SURGERY X 5 YEARS AGO. STATES HURTS IN MEDIAL KNEE. MT

TECHNIQUE

Standing AP, lateral, and sunrise views of the right knee

COMPARISONS

None

FINDINGS

No acute fracture or malalignment. Mild tricompartmental narrowing and osteophyte formation. A
moderate joint effusion is present. There is a phlebolith in the subcutaneous soft tissue of the
anterior leg.

IMPRESSION

Mild tricompartmental osteoarthrosis. Moderate joint effusion.

## 2017-01-08 IMAGING — CR LOW_EXM
3 series · 3 of 3 positions shown · non-contrast
Comparison: none

PROCEDURE: LOW_EXM
HISTORY: Patient c/o right knee pain after steroid injection x2 days ago.

[knee ap]
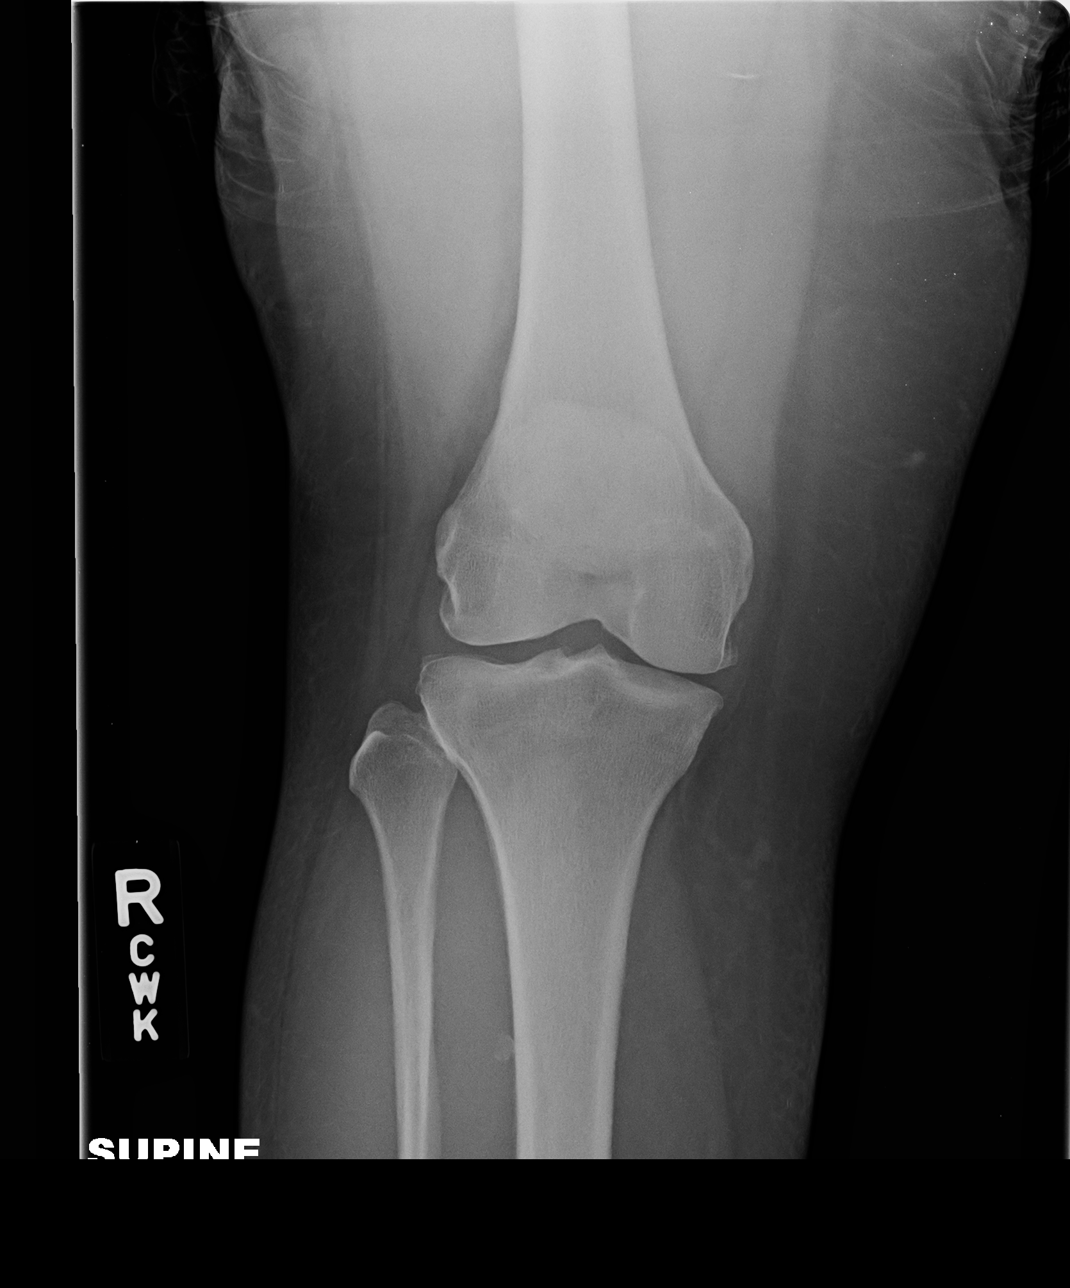

[knee sunrise]
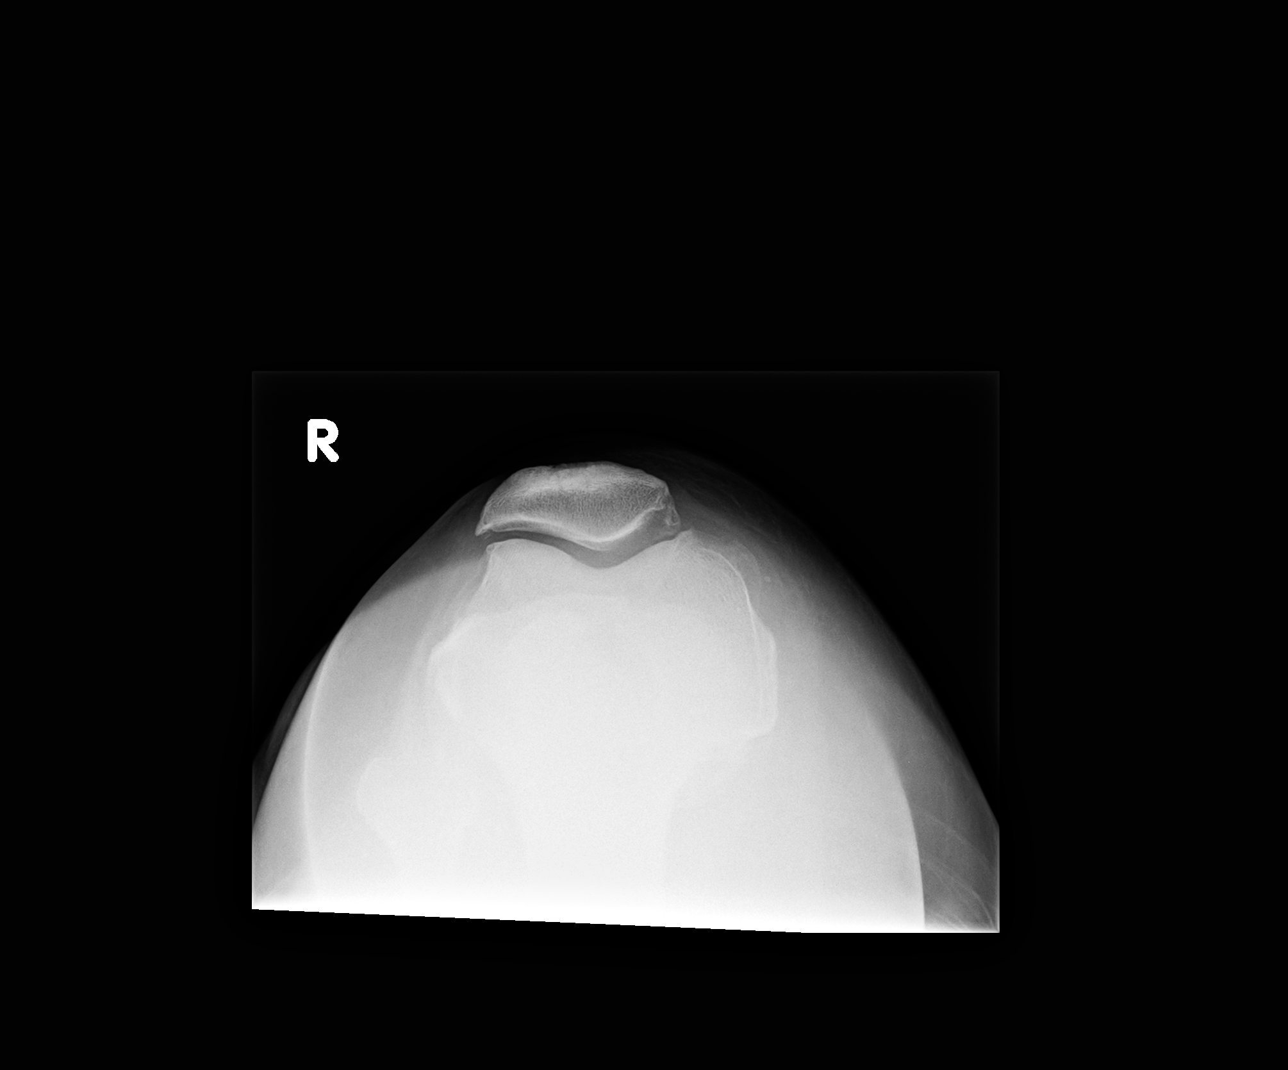

[knee lat]
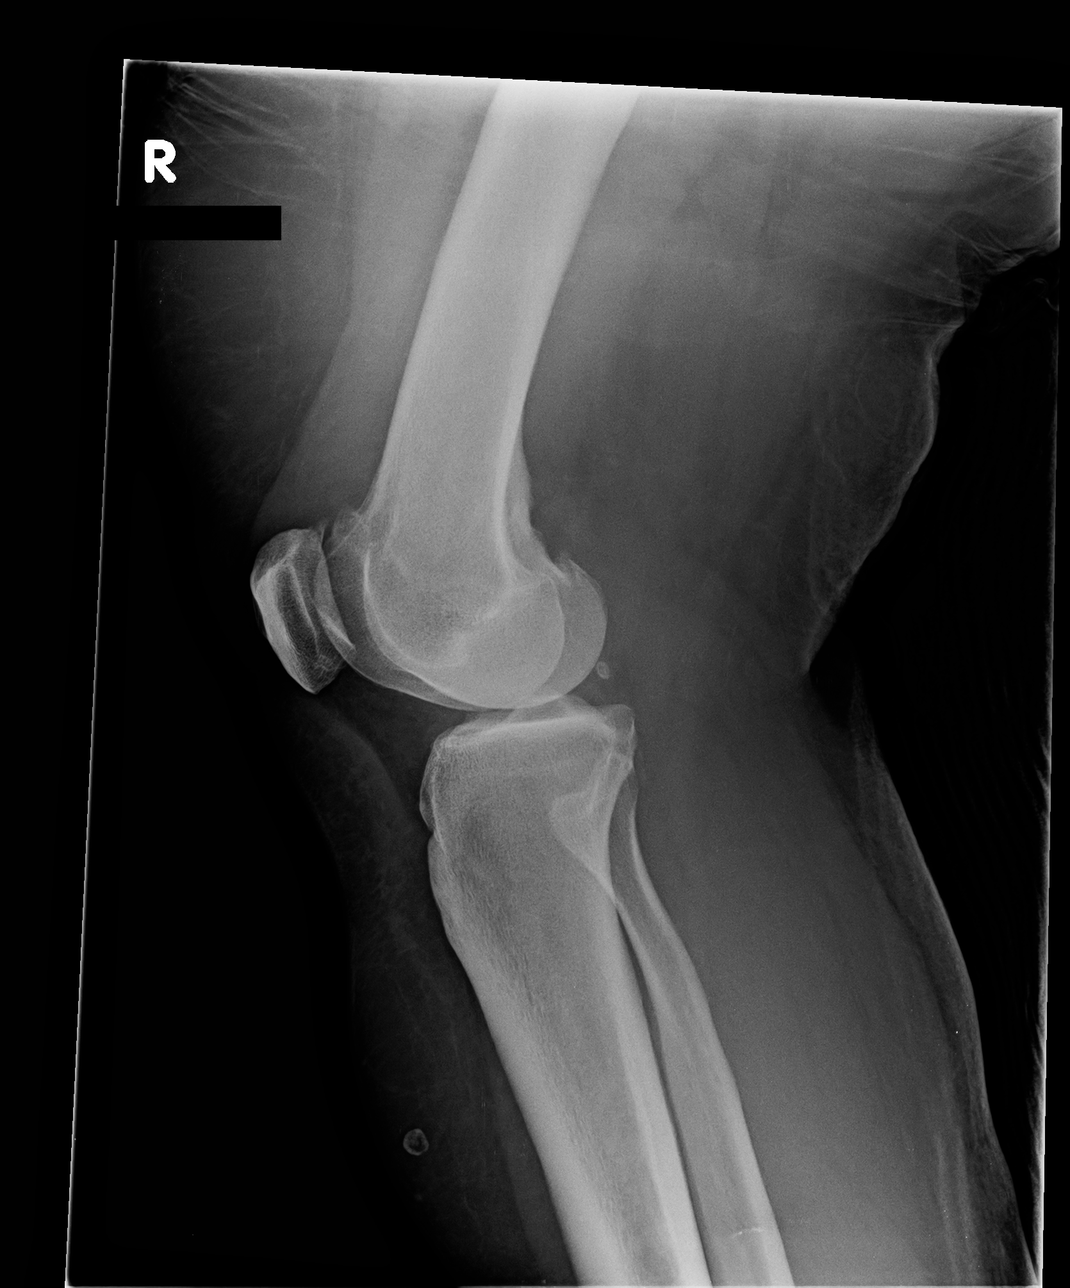

[3 of 3 positions shown; findings below may reference images not displayed]

FINDINGS: 3 views of the right knee without comparison show no acute fracture or dislocation. There
is small suprapatellar effusion seen. Mild joint space narrowing is seen most prominent of the
medial
tibial femoral joint. There is no plain film evidence for osteochondral defect. No osseous erosions
are
seen.
IMPRESSION: 1. Right knee Kellgren and Lawrence grade 2 mild osteoarthritis predominantly involving the medial
tibiofemoral joint as above.
2. No acute fracture or dislocation.

## 2017-01-16 IMAGING — CR ABDOMEN
3 series · 3 of 3 positions shown · non-contrast
Comparison: none

[abdomen upright]
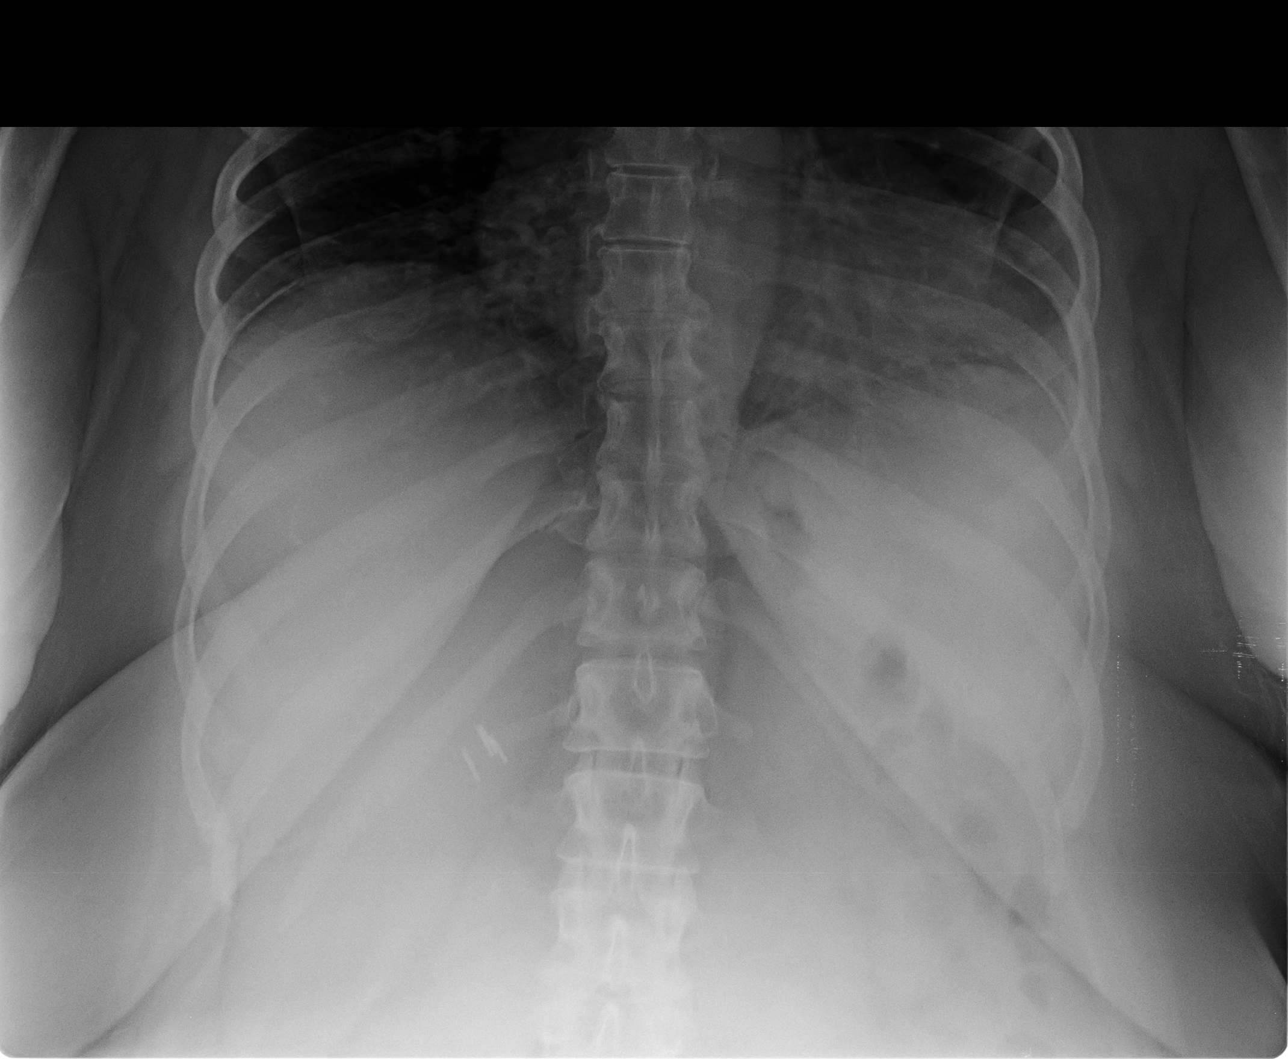

[abdomen supine kub (1 of 2)]
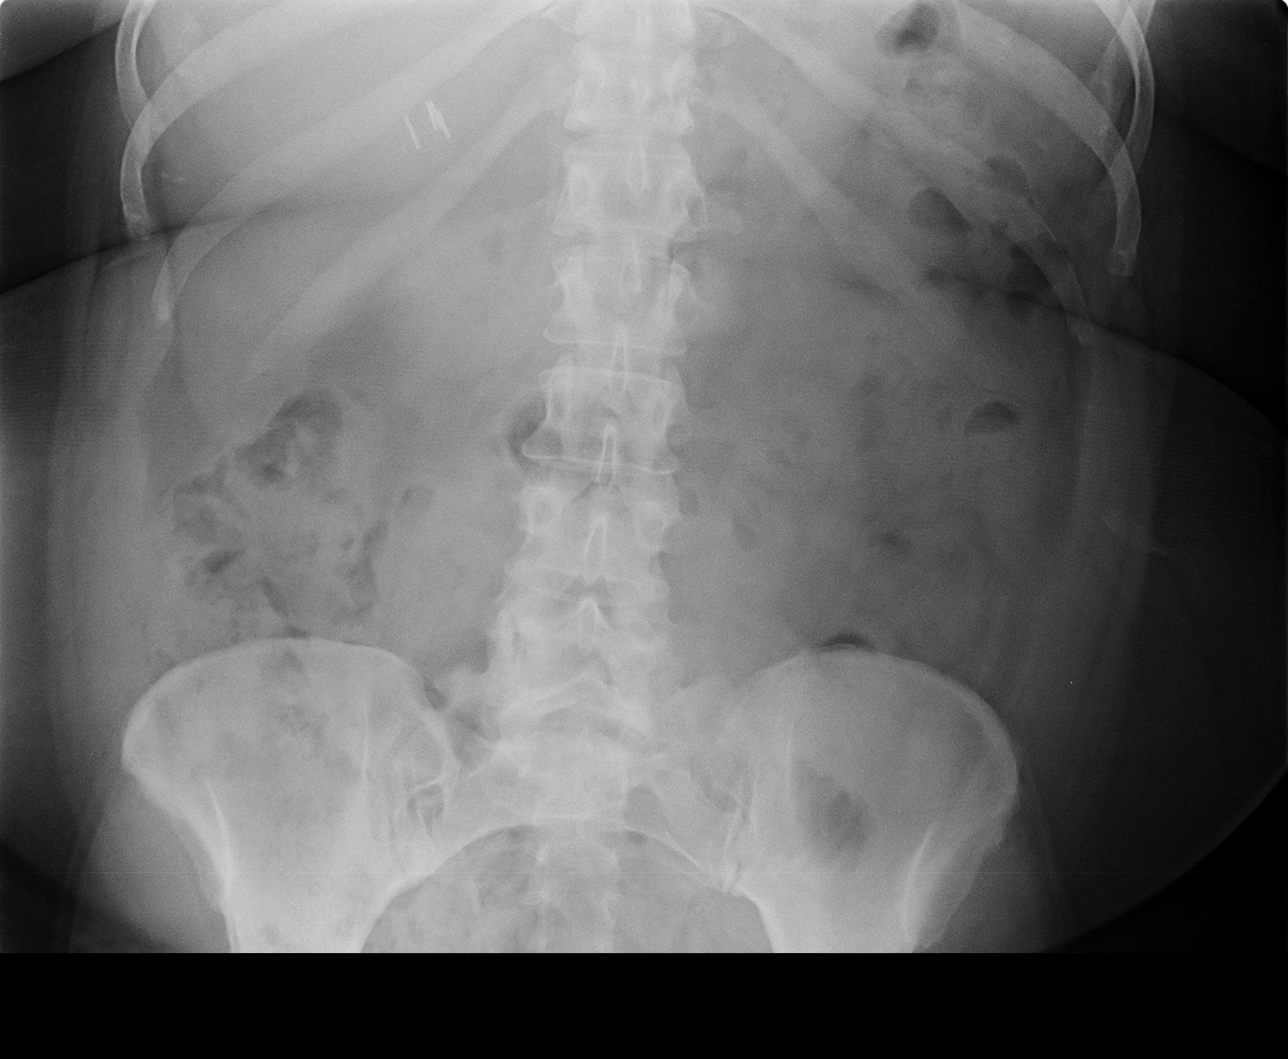

[abdomen supine kub (2 of 2)]
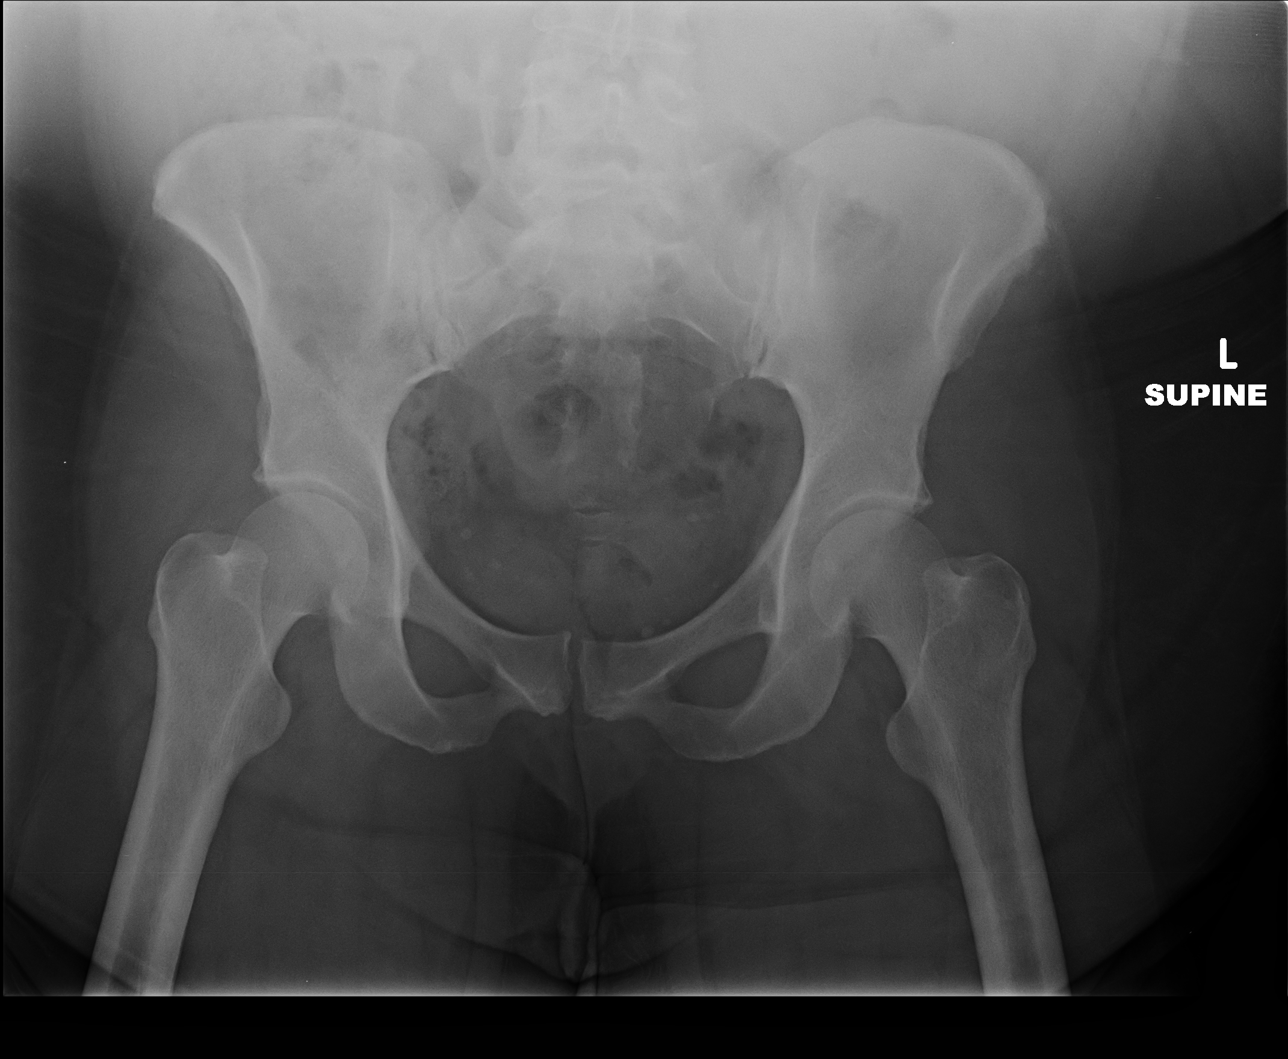

[3 of 3 positions shown; findings below may reference images not displayed]

EXAM

ABDOMEN COMPLETE, RADIOLOGIC EXAMINATION, ABDOMEN; COMPLETE, INCLUDING DECUBITUS AND/OR ERECT
VIEWS CPT 97292

INDICATION

right abdominal pain
PT. C/O ABD PAIN.

TECHNIQUE

2 views of the abdomen were acquired.

COMPARISONS

Reference is made to CT scan of the abdomen and pelvis dated 11/28/2016. Acute abdominal series
dated 10/31/2015 was reviewed as well.

FINDINGS

Upright and supine views of the abdomen show no high-grade bowel obstruction or free air. The lung
bases are clear. Bowel gas pattern is unremarkable. Postcholecystectomy clips are redemonstrated.

No abnormal calcifications. Visualized bones appear adequately mineralized. There is spondylosis
of the lower lumbar spine.

IMPRESSION

No high-grade bowel obstruction or free air. Postcholecystectomy clips.

## 2017-01-17 IMAGING — CT Abdomen^1_KIDNEY_STONE (Adult)
1 series · 15 of 32 positions shown, 19 images · non-contrast
Comparison: none

PROCEDURE: ABDOMEN 1_KIDNEY_STONE (ADULT)
HISTORY: RIGHT FLANK PAIN, RADIATION TO RIGHT GROIN. NAUSEA. HX CONSTIPATION.
DIABETIC
TECHNIQUE: Axial CT imaging of the abdomen and pelvis was performed without contrast. This exam
was first performed using one or more the following dose reduction techniques: Automated exposure
control, adjustment of the mA and or KV according to patient's size, or use of a iterative
reconstruction technique. Total DLP dose measures 746 mGy with a total CTDI dose measuring 20
mGy.

[Series 2: kidney stone 3.0 soft tissue · axial · 0.88mm/px · z∈[-414,-75]mm · 15 of 125 slices shown, 19 images]
[im 9/125  soft-tissue]
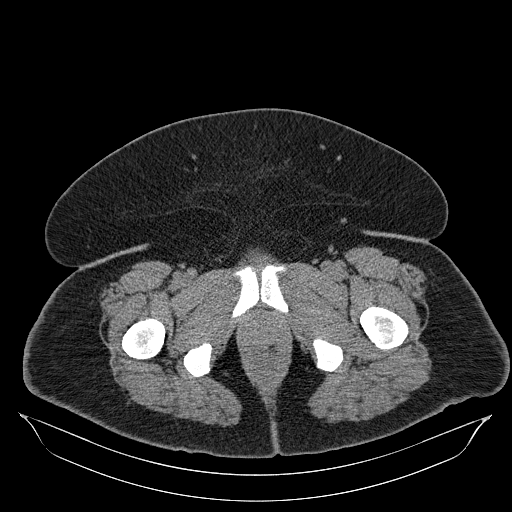
[im 9/125  bone]
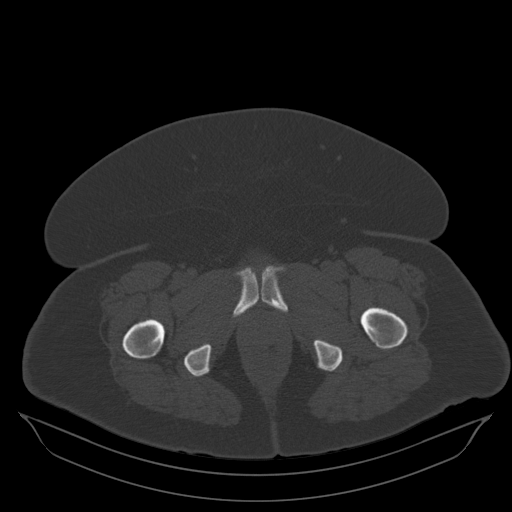
[im 17/125  soft-tissue]
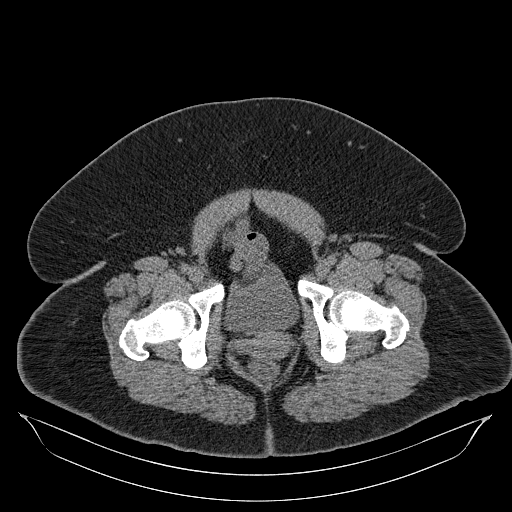
[im 25/125  soft-tissue]
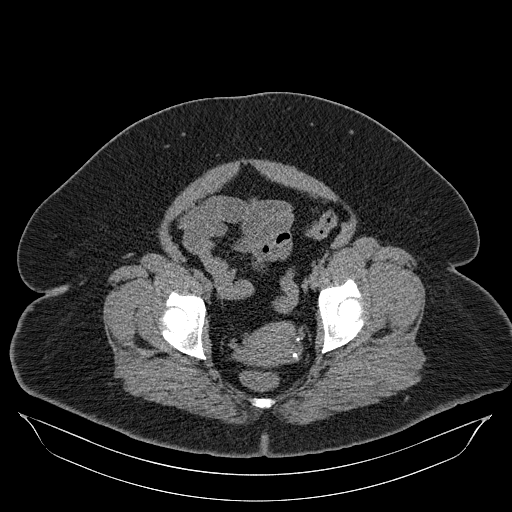
[im 37/125  soft-tissue]
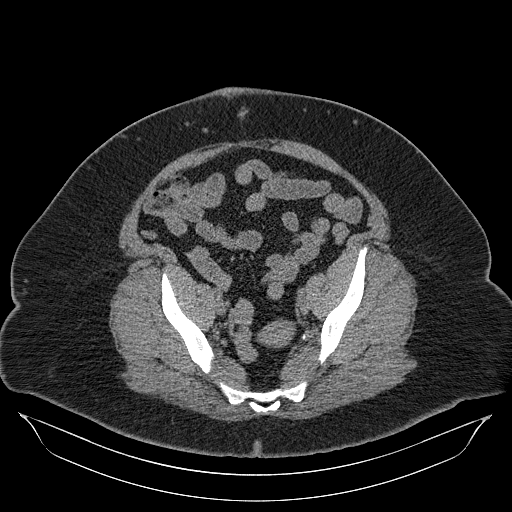
[im 45/125  soft-tissue]
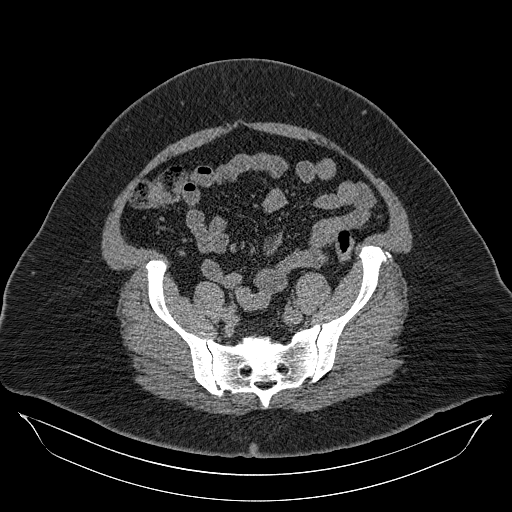
[im 53/125  soft-tissue]
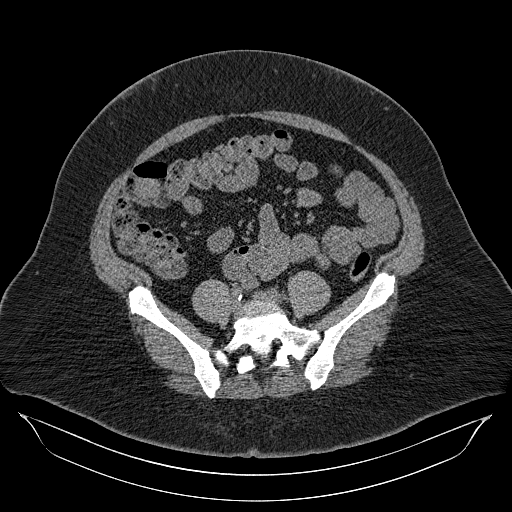
[im 65/125  soft-tissue]
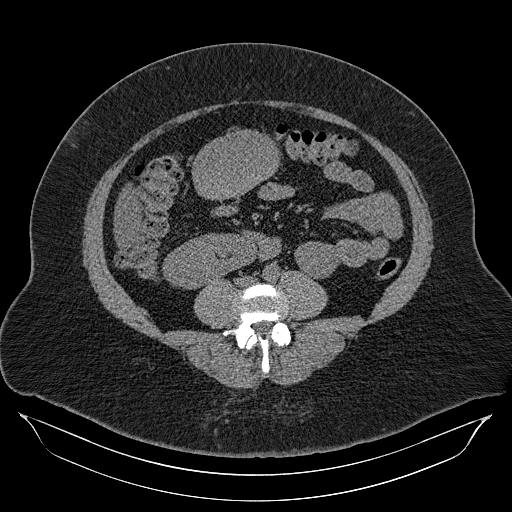
[im 73/125  soft-tissue]
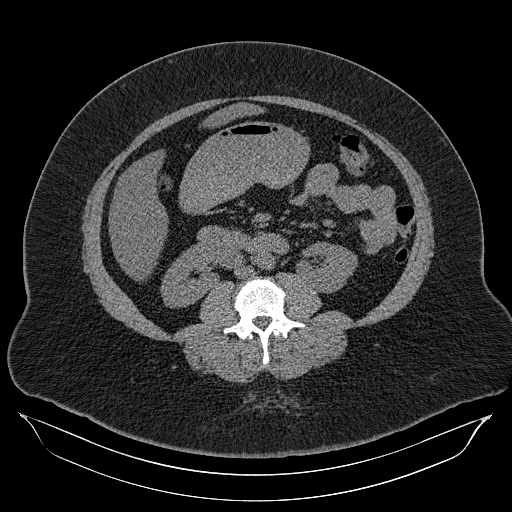
[im 81/125  soft-tissue]
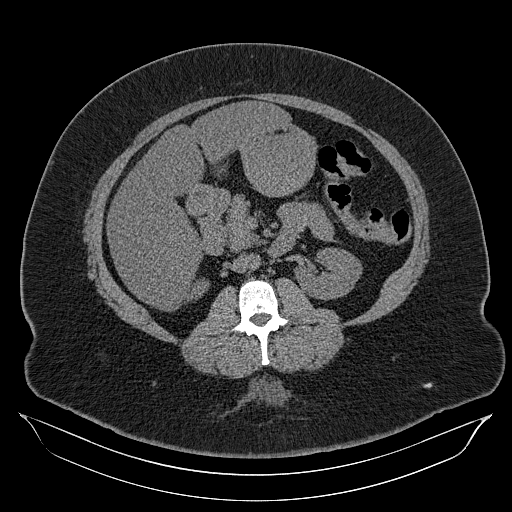
[im 81/125  bone]
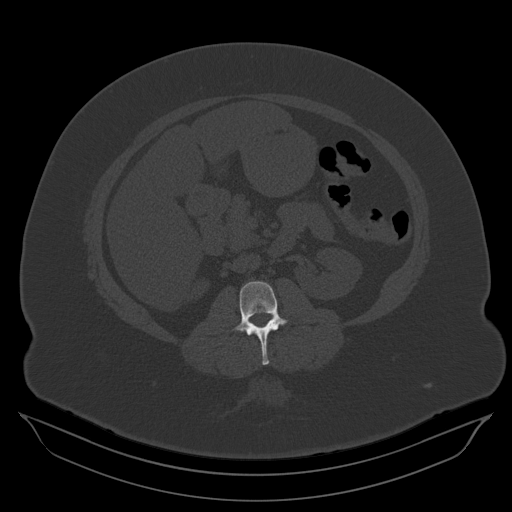
[im 89/125  soft-tissue]
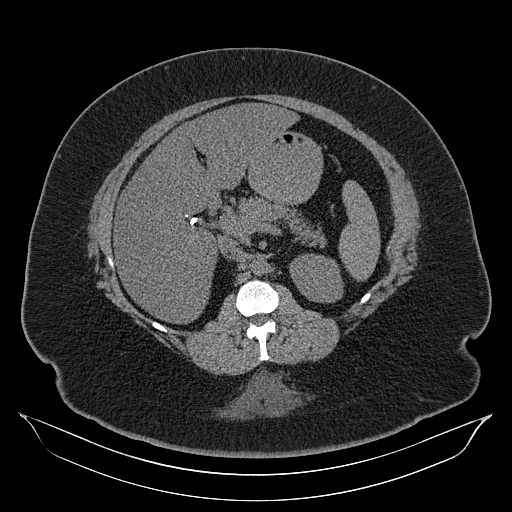
[im 101/125  soft-tissue]
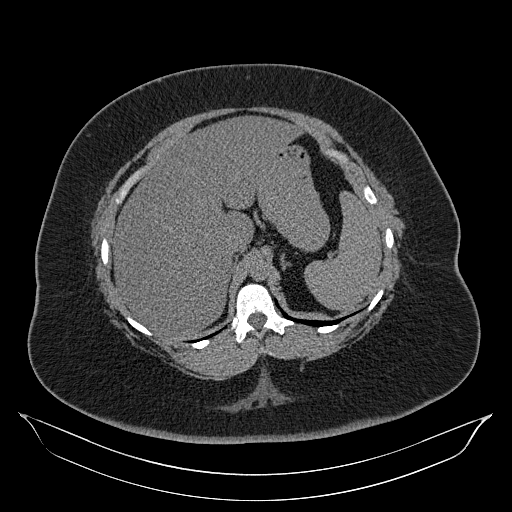
[im 109/125  soft-tissue]
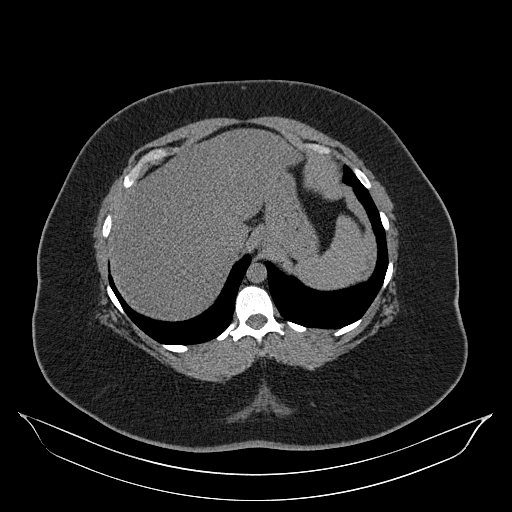
[im 109/125  lung]
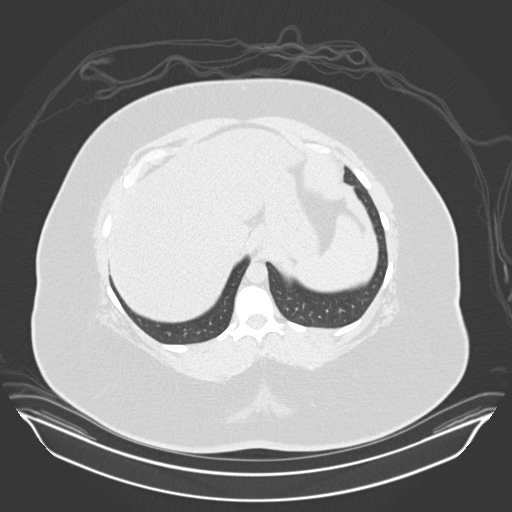
[im 113/125  lung]
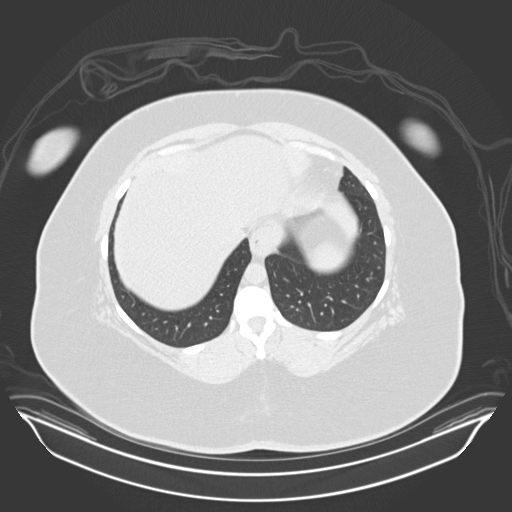
[im 117/125  soft-tissue]
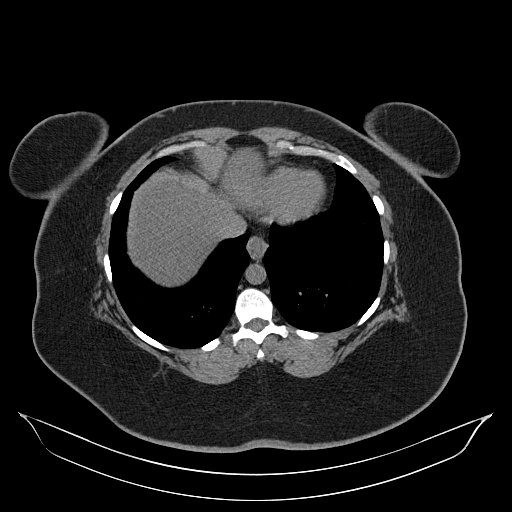
[im 117/125  lung]
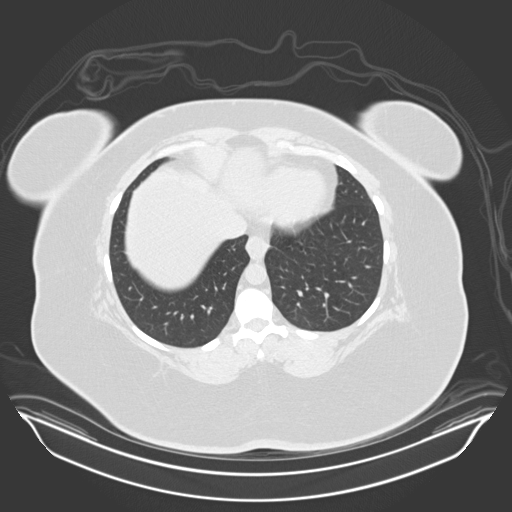
[im 121/125  lung]
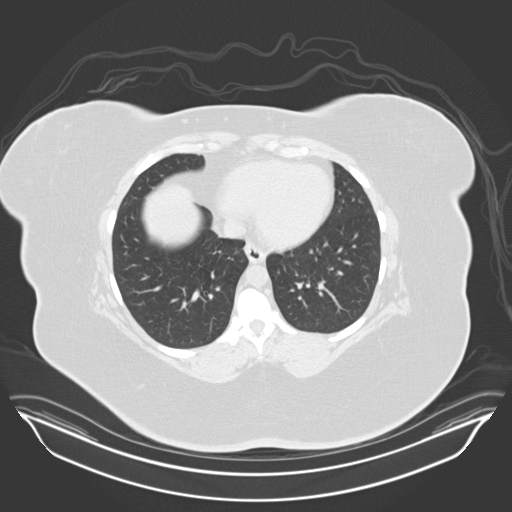

[15 of 32 positions shown; findings below may reference images not displayed]

FINDINGS: The lung bases are clear. Surgical changes are seen reflecting cholecystectomy. The noncontrast
visualized portions of the liver, spleen, pancreas, and adrenals are within normal limits. The right
kidney shows no hydronephrosis, perinephric fat stranding, or obstructing renal calculi. The left
kidney shows no hydronephrosis, perinephric fat stranding, or obstructing renal calculi. The aorta
is
normal in course and caliber. No pathologically dilated loops of large or small bowel are seen.
Noninflammatory appendix is seen in the right lower quadrant. There is no free air or free fluid
seen in
the abdomen. The noncontrast urinary bladder is unremarkable.
IMPRESSION: 1. There is no hydronephrosis, perinephric fat stranding, or obstructing renal calculi.
2. Nonobstructive bowel gas pattern without free air.

## 2017-04-12 LAB — COMPREHENSIVE METABOLIC PANEL
Lab: 0.6
Lab: 0.8 — ABNORMAL LOW (ref 33.0–37.0)
Lab: 17
Lab: 21
Lab: 22
Lab: 24
Lab: 3.5
Lab: 6.7
Lab: 73
Lab: 87

## 2017-04-12 IMAGING — CR ABDOMEN
4 series · 4 of 4 positions shown · non-contrast
Comparison: none

[chest pa x-wise]
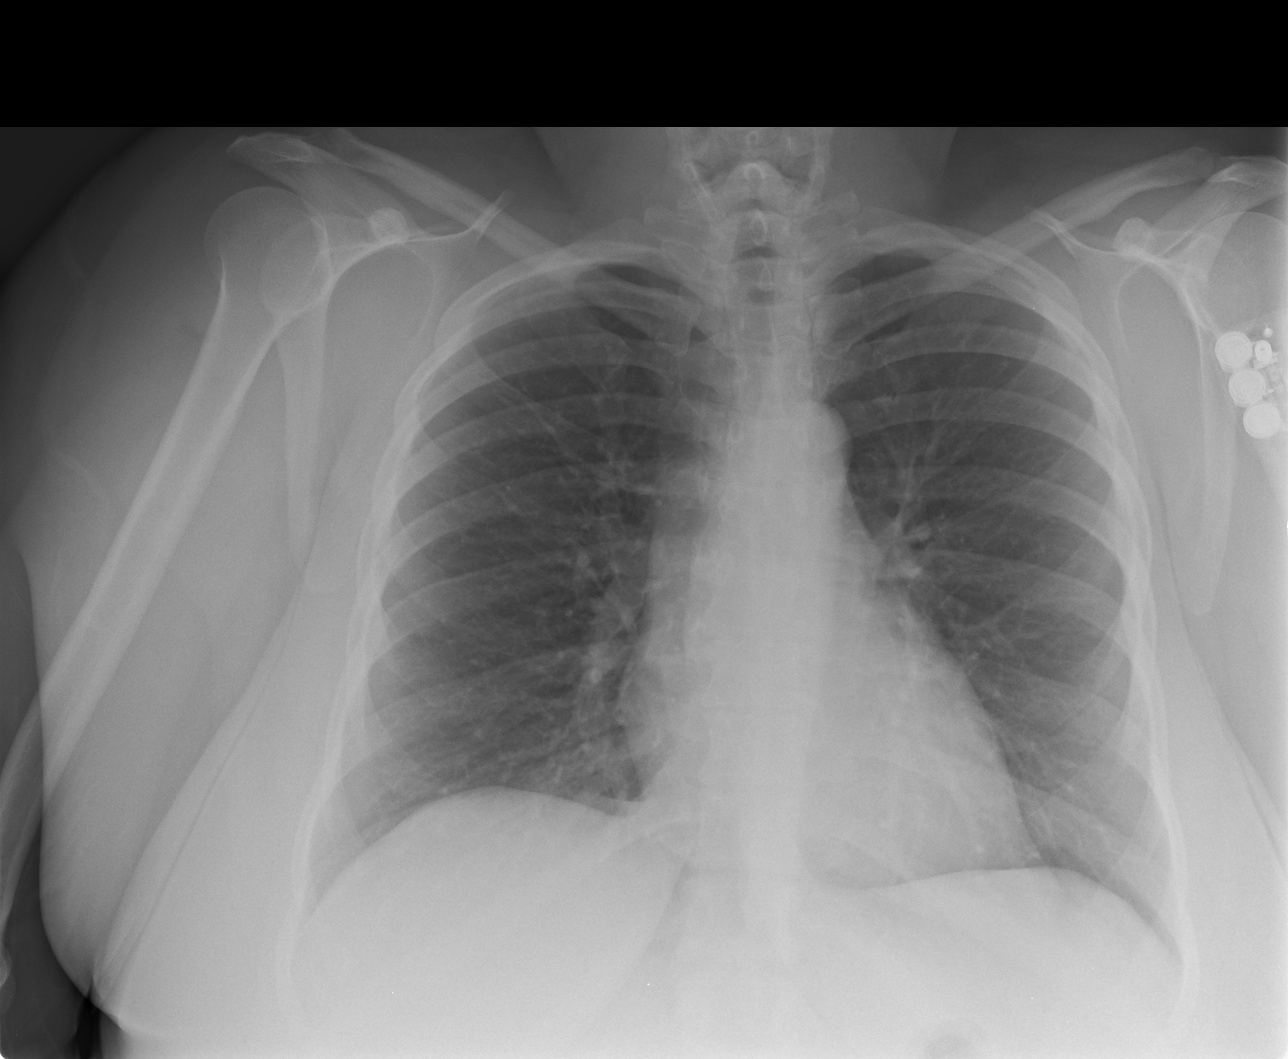

[abd uprt x-wise]
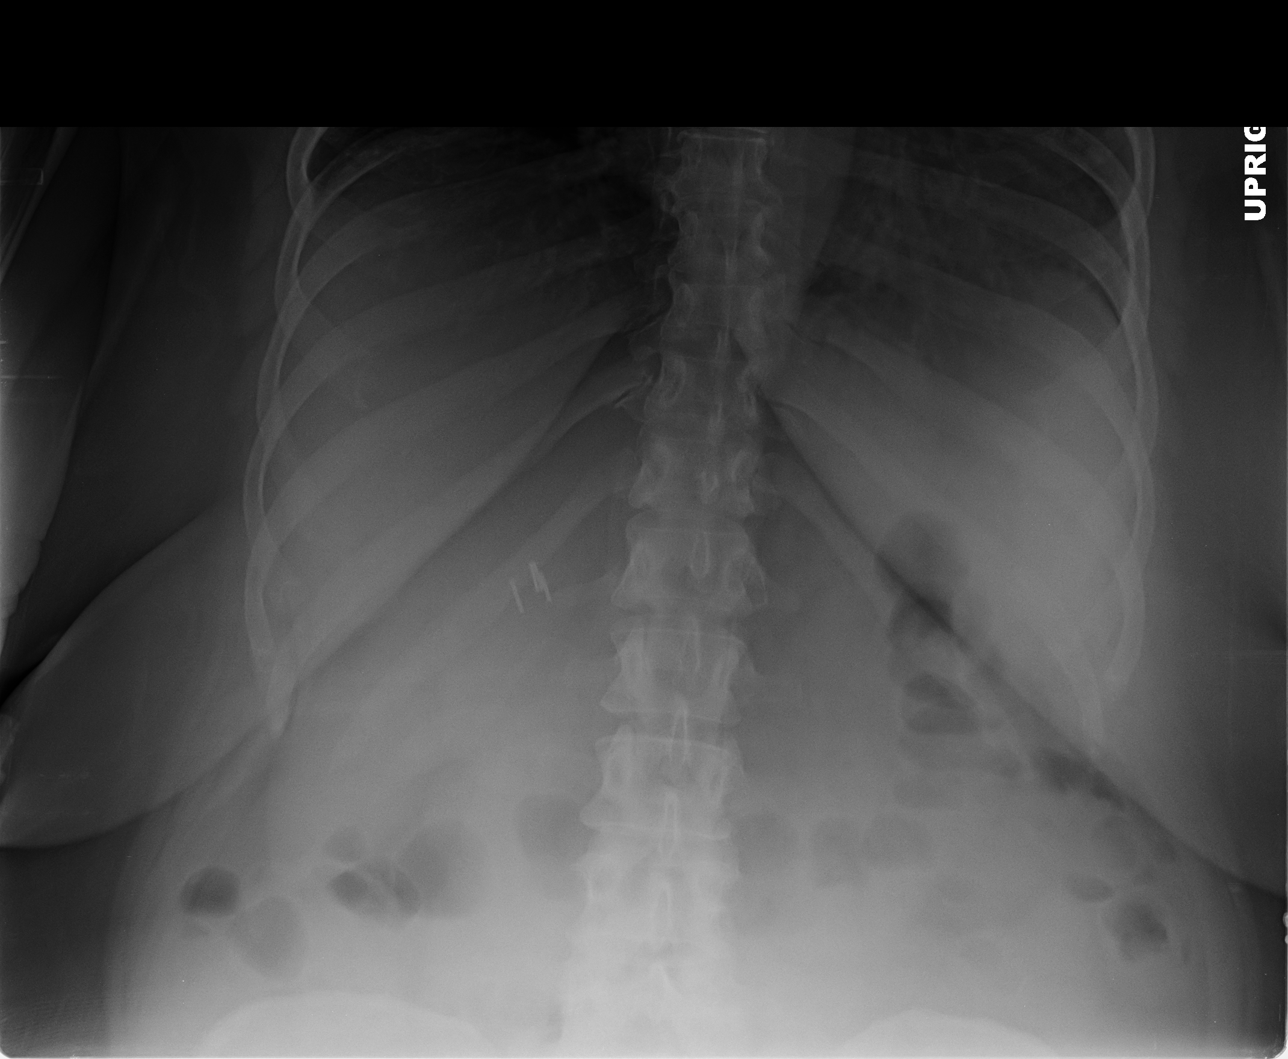

[abd supine x-wise (1 of 2)]
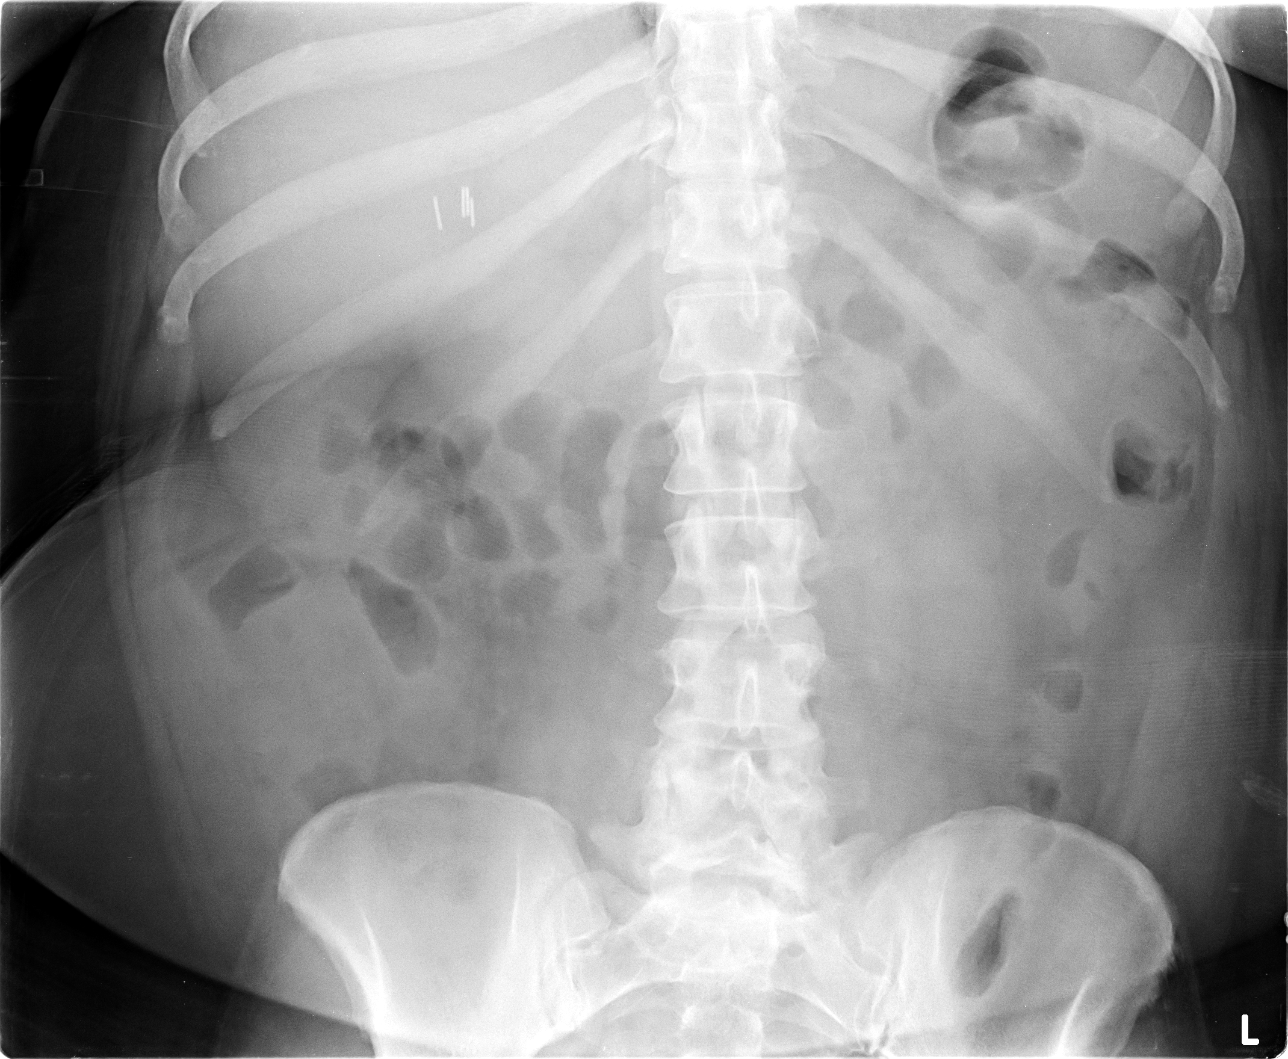

[abd supine x-wise (2 of 2)]
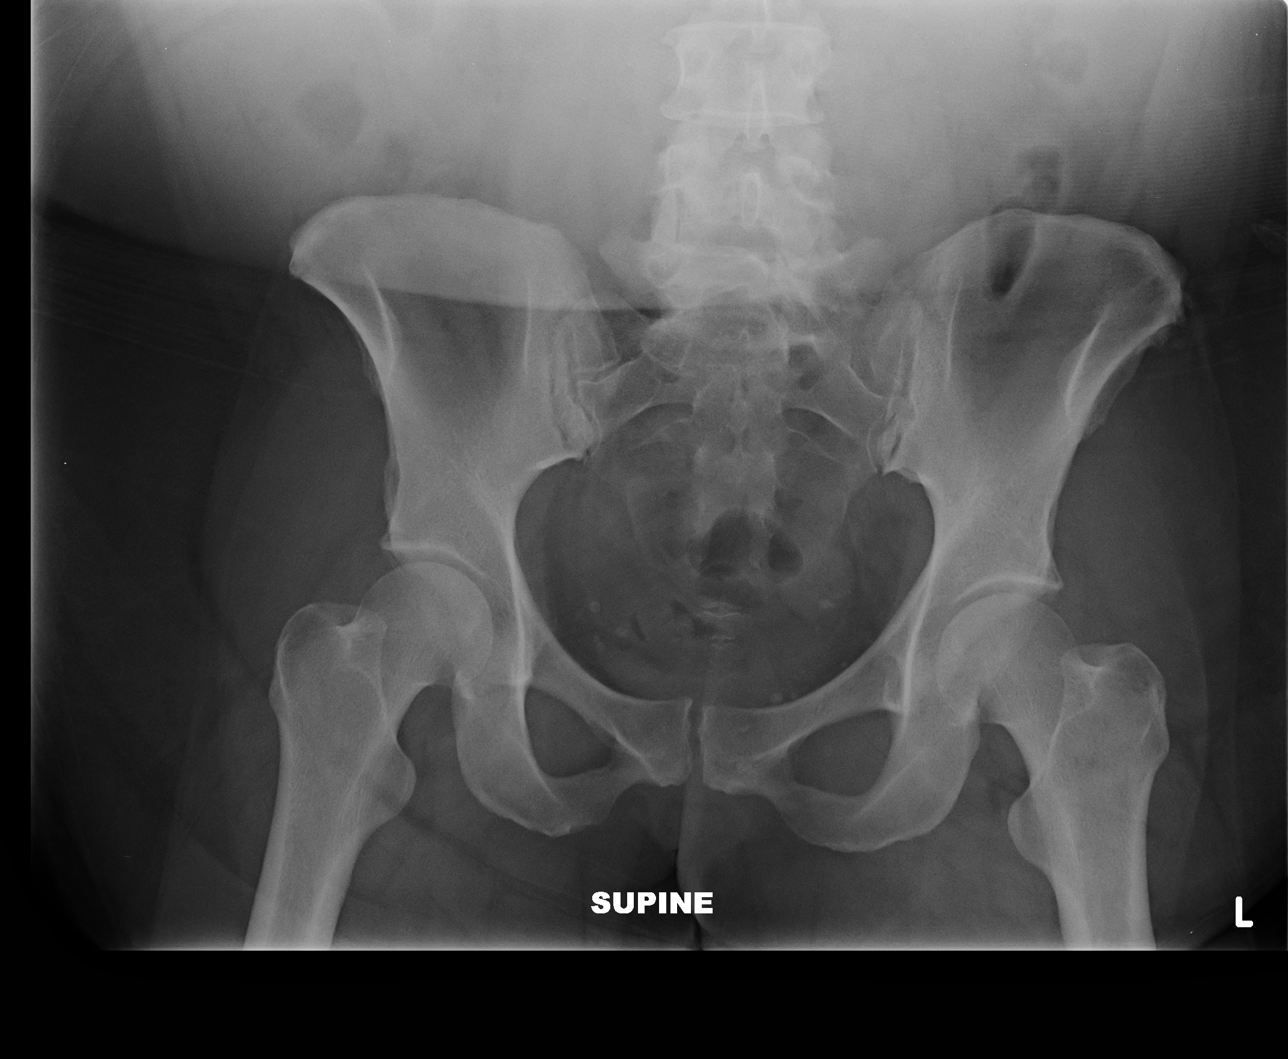

[4 of 4 positions shown; findings below may reference images not displayed]

EXAM
Acute abdominal series.

INDICATION
nausea/vomiting/pain
NAUSEA, VOMITING STARTING TUESDAY. LOWER ABD PAIN, STARTED AFTER VOMITING.
HX CHOLE.

FINDINGS
The prior study was reviewed from 01/16/2017.
Supine and upright views of the abdomen and a single view of the chest were obtained.
The bowel gas pattern is within normal limits. There is gas within nondistended colon and small
bowel.
There are metallic clips in the right upper abdominal quadrant.
There is no evidence of pneumoperitoneum.
The chest is clear.

IMPRESSION
There is no significant abdominal soft tissue abnormality. There is no obstruction.
The chest is clear.

## 2017-07-08 ENCOUNTER — Encounter: Admit: 2017-07-08 | Discharge: 2017-07-08 | Payer: MEDICARE

## 2017-07-08 DIAGNOSIS — F329 Major depressive disorder, single episode, unspecified: Principal | ICD-10-CM

## 2017-07-08 DIAGNOSIS — E0590 Thyrotoxicosis, unspecified without thyrotoxic crisis or storm: ICD-10-CM

## 2017-07-08 DIAGNOSIS — E785 Hyperlipidemia, unspecified: ICD-10-CM

## 2017-07-08 DIAGNOSIS — E119 Type 2 diabetes mellitus without complications: Secondary | ICD-10-CM

## 2017-07-08 DIAGNOSIS — I10 Essential (primary) hypertension: ICD-10-CM

## 2017-07-11 ENCOUNTER — Encounter: Admit: 2017-07-11 | Discharge: 2017-07-11 | Payer: MEDICARE

## 2017-07-11 ENCOUNTER — Ambulatory Visit: Admit: 2017-07-11 | Discharge: 2017-07-12 | Payer: MEDICARE

## 2017-07-11 DIAGNOSIS — E785 Hyperlipidemia, unspecified: ICD-10-CM

## 2017-07-11 DIAGNOSIS — Z8249 Family history of ischemic heart disease and other diseases of the circulatory system: ICD-10-CM

## 2017-07-11 DIAGNOSIS — I10 Essential (primary) hypertension: ICD-10-CM

## 2017-07-11 DIAGNOSIS — E0590 Thyrotoxicosis, unspecified without thyrotoxic crisis or storm: ICD-10-CM

## 2017-07-11 DIAGNOSIS — E119 Type 2 diabetes mellitus without complications: Secondary | ICD-10-CM

## 2017-07-11 DIAGNOSIS — E782 Mixed hyperlipidemia: Principal | ICD-10-CM

## 2017-07-11 DIAGNOSIS — F329 Major depressive disorder, single episode, unspecified: Principal | ICD-10-CM

## 2017-07-11 DIAGNOSIS — R072 Precordial pain: ICD-10-CM

## 2017-07-11 DIAGNOSIS — Z6841 Body Mass Index (BMI) 40.0 and over, adult: ICD-10-CM

## 2017-07-16 ENCOUNTER — Encounter: Admit: 2017-07-16 | Discharge: 2017-07-16 | Payer: MEDICARE

## 2017-07-23 ENCOUNTER — Ambulatory Visit: Admit: 2017-07-23 | Discharge: 2017-07-23 | Payer: MEDICARE

## 2017-07-23 DIAGNOSIS — Z8249 Family history of ischemic heart disease and other diseases of the circulatory system: ICD-10-CM

## 2017-07-23 DIAGNOSIS — Z6841 Body Mass Index (BMI) 40.0 and over, adult: ICD-10-CM

## 2017-07-23 DIAGNOSIS — E782 Mixed hyperlipidemia: Principal | ICD-10-CM

## 2017-07-23 DIAGNOSIS — R072 Precordial pain: ICD-10-CM

## 2017-07-23 MED ORDER — PERFLUTREN LIPID MICROSPHERES 1.1 MG/ML IV SUSP
1-20 mL | Freq: Once | INTRAVENOUS | 0 refills | Status: CP | PRN
Start: 2017-07-23 — End: ?
  Administered 2017-07-23: 17:00:00 2.5 mL via INTRAVENOUS

## 2017-07-26 ENCOUNTER — Encounter: Admit: 2017-07-26 | Discharge: 2017-07-26 | Payer: MEDICARE

## 2017-07-29 ENCOUNTER — Encounter: Admit: 2017-07-29 | Discharge: 2017-07-29 | Payer: MEDICARE

## 2017-10-08 IMAGING — US PELCM
1 series · 14 of 25 positions shown · non-contrast
Comparison: none

[Series 1: us pelvic complete · 14 of 58 slices shown]
[im 1/58]
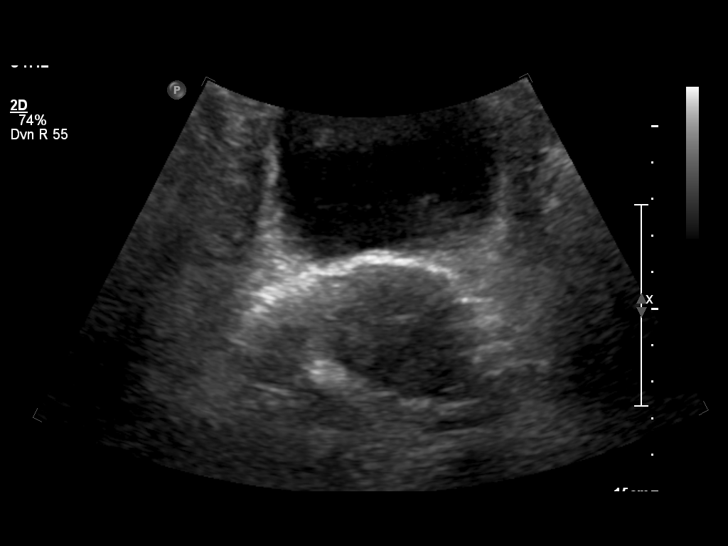
[im 5/58]
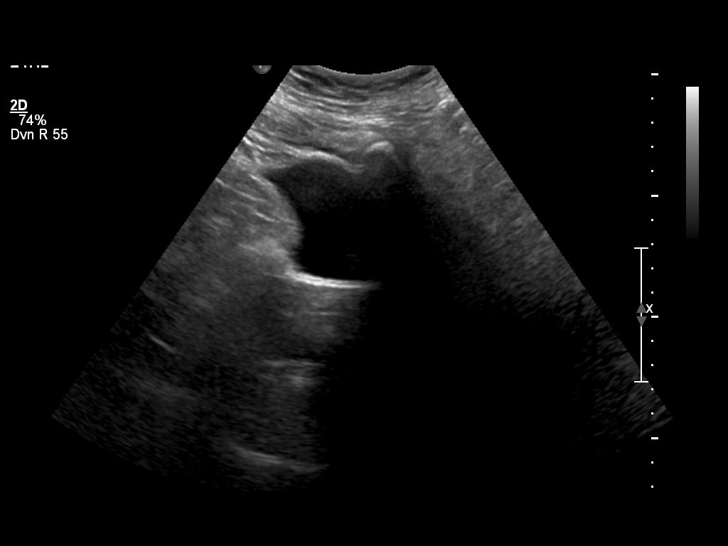
[im 10/58]
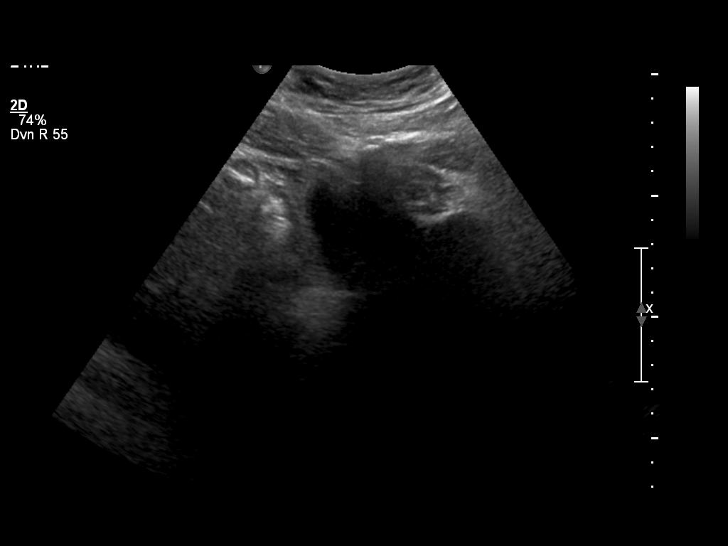
[im 15/58]
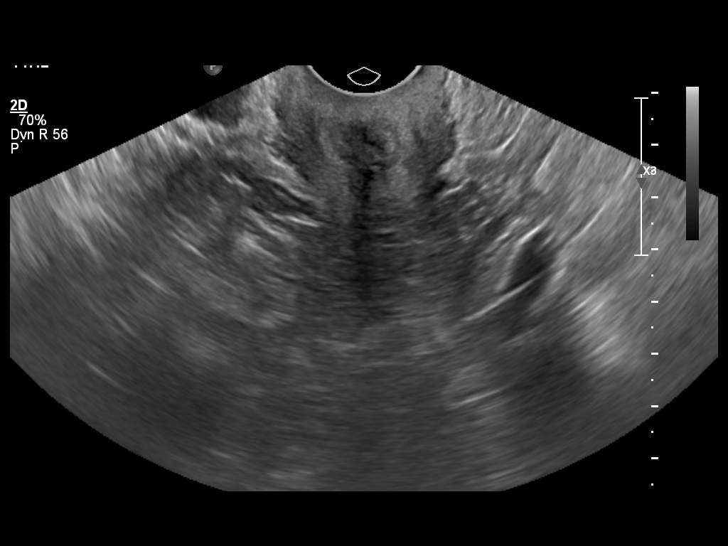
[im 20/58]
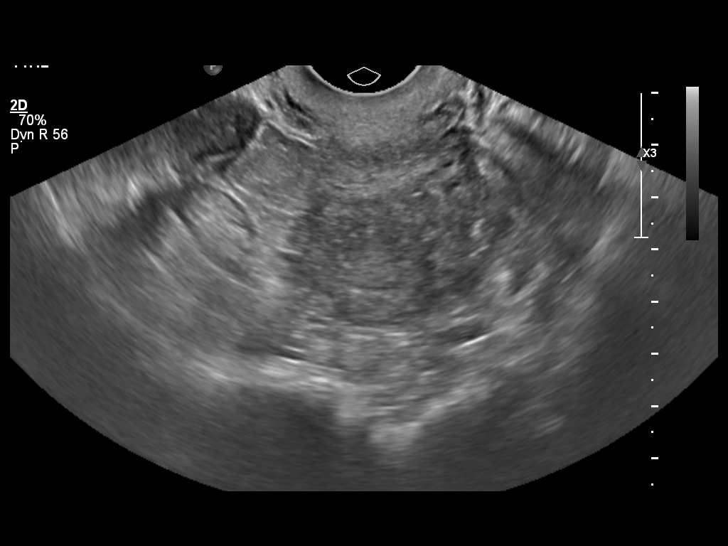
[im 22/58]
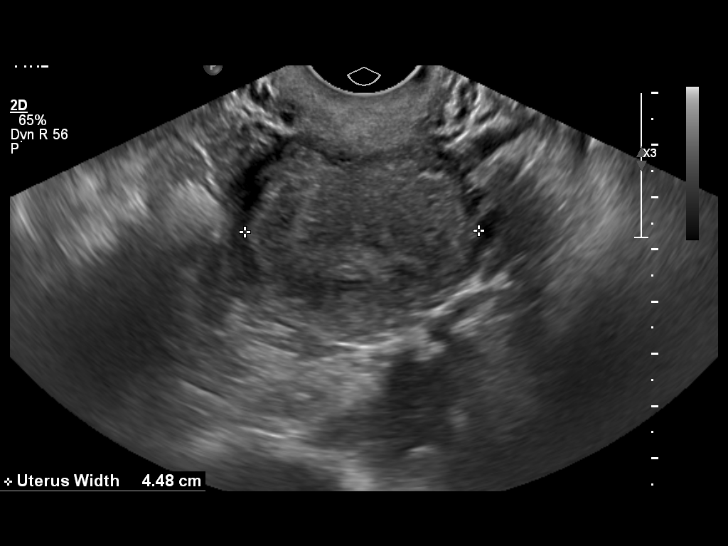
[im 27/58]
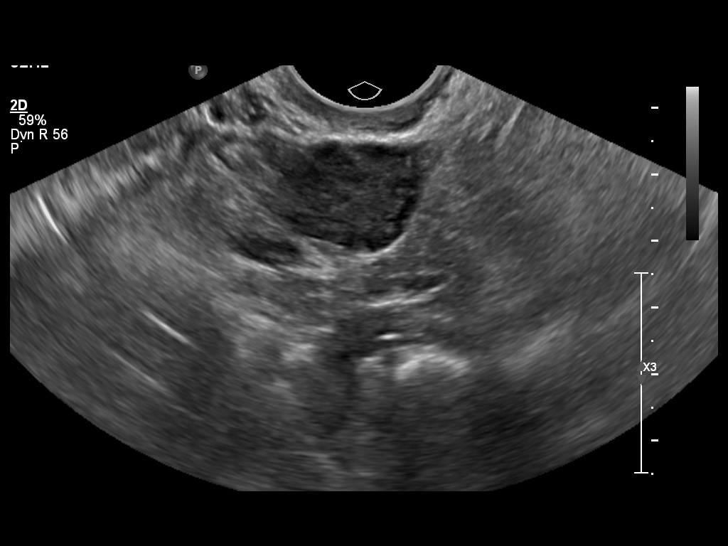
[im 31/58]
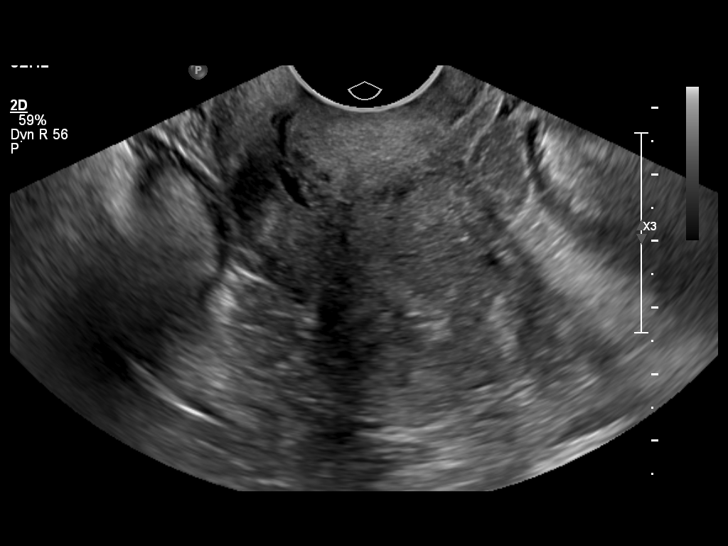
[im 36/58]
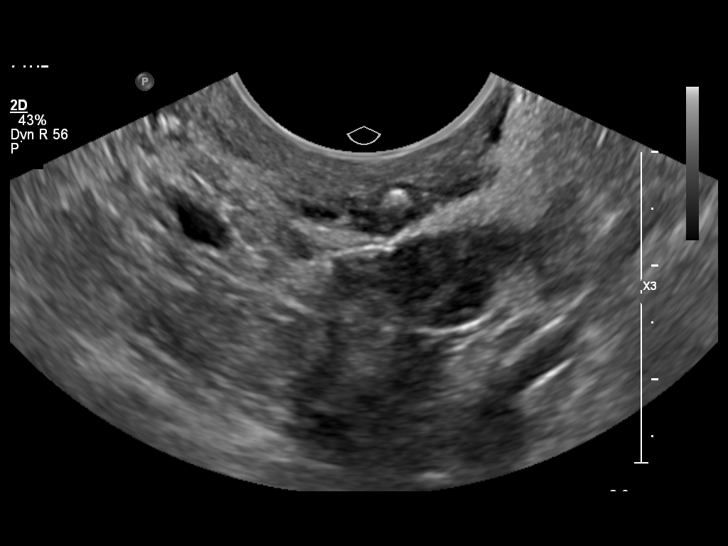
[im 39/58]
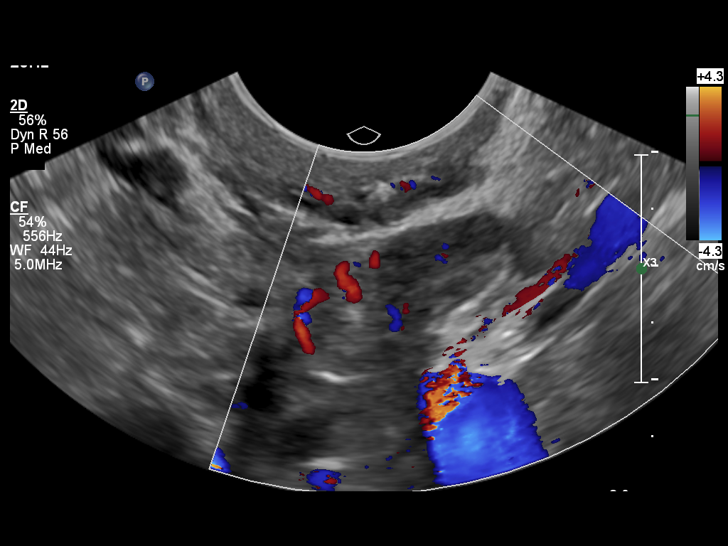
[im 43/58]
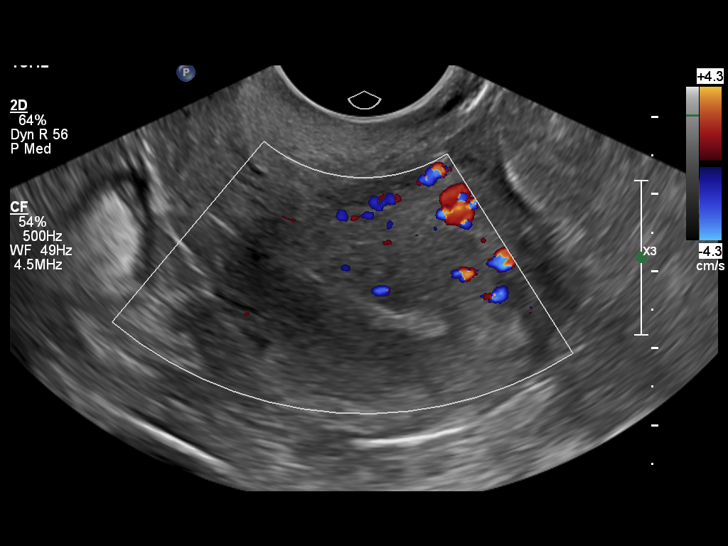
[im 48/58]
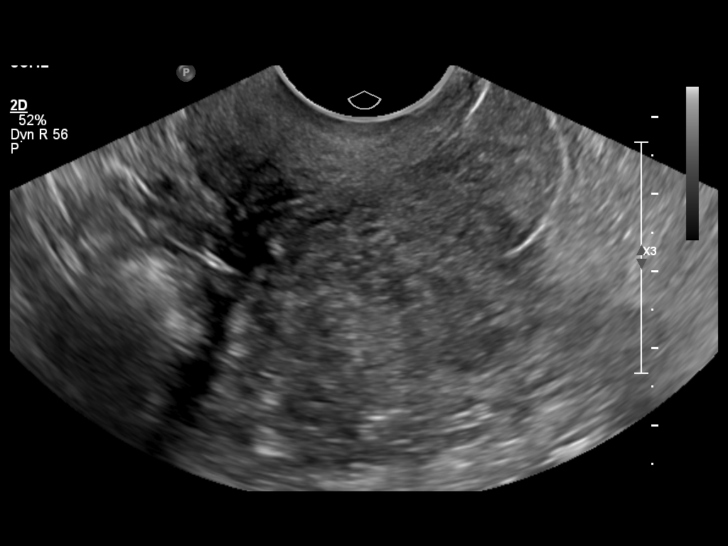
[im 53/58]
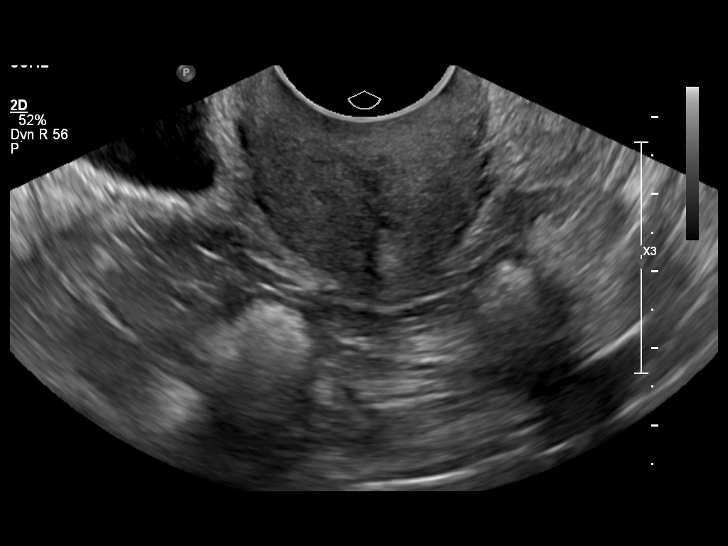
[im 58/58]
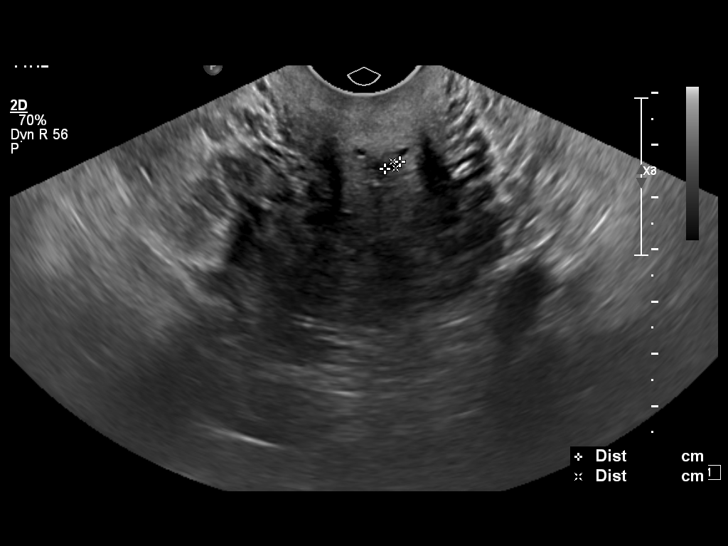

[14 of 25 positions shown; findings below may reference images not displayed]

EXAM
Pelvic complete.

INDICATION
Postmenopausal bleeding.

FINDINGS
Transabdominal and endovaginal ultrasound has been performed.
The uterus measures 5.6 x 3.9 x 5.4 centimeters. The endometrium measures 4.1 millimeters.

The uterus is smoothly marginated no masses seen.
The right ovary measures 2.5 a 1.5 a 1.8 centimeters with good arterial blood flow.

The left over measures 1.6 x 1 x 0.9 centimeters. There is good blood flow.
No free fluid is seen. There is no adnexal mass.

IMPRESSION
No free fluid or adnexal masses identified.
The endometrium is not widened at 4.1 millimeters. No masses seen.

Tech Notes:

POST MENOPAUSAL BLEEDING OFF AND ON X5-6 MONTHS; HX OF D C 2 YRS AGO;

## 2017-10-10 ENCOUNTER — Encounter: Admit: 2017-10-10 | Discharge: 2017-10-10 | Payer: MEDICARE

## 2017-10-10 LAB — COMPREHENSIVE METABOLIC PANEL
Lab: 0.3
Lab: 0.9 — ABNORMAL LOW (ref 33.0–37.0)
Lab: 102
Lab: 105
Lab: 13
Lab: 17
Lab: 173 — ABNORMAL HIGH (ref 70–105)
Lab: 22
Lab: 24
Lab: 27
Lab: 4.1
Lab: 4.2
Lab: 64
Lab: 7.8
Lab: 9.8

## 2017-10-10 IMAGING — CR CHEST
1 series · 1 of 1 positions shown · non-contrast
Comparison: none

[chest port x-wise]
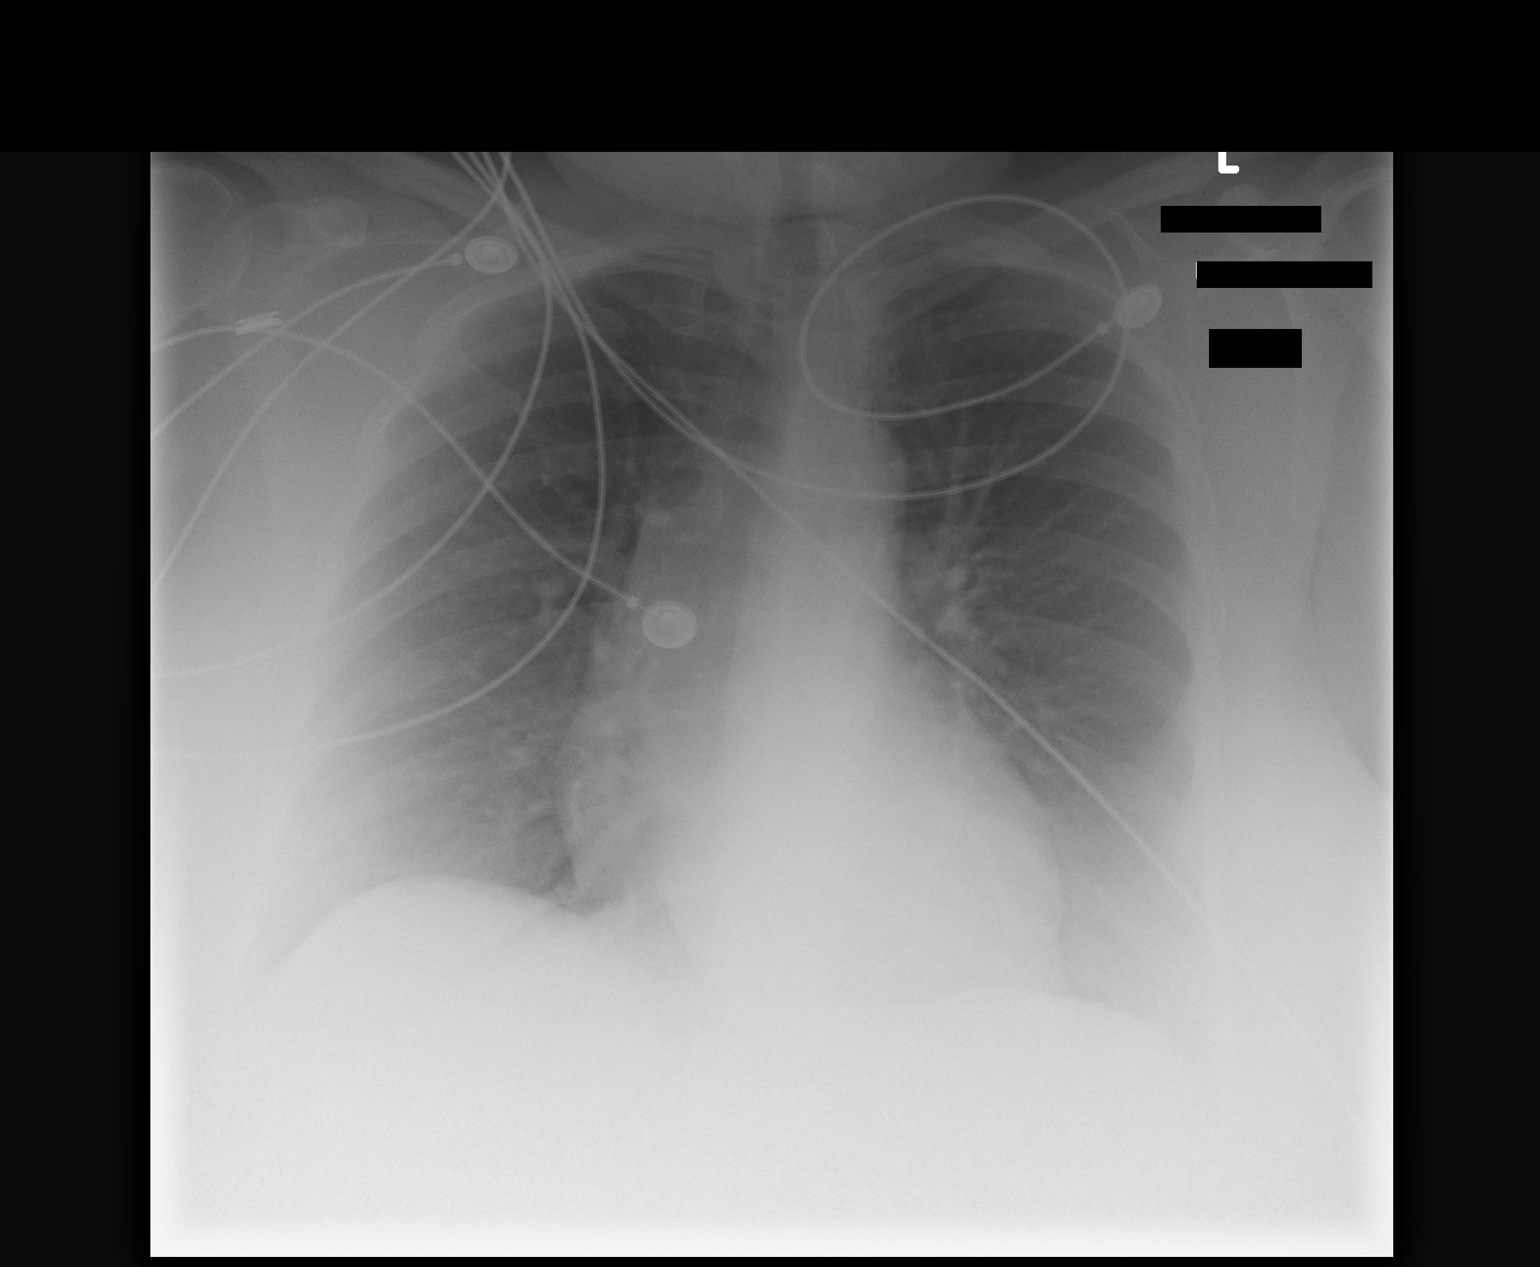

[1 of 1 positions shown; findings below may reference images not displayed]

DIAGNOSTIC STUDIES

EXAM

Portable AP chest.

INDICATION

chest pain
chest pain

TECHNIQUE

Portable AP chest was obtained.

COMPARISONS

None available.

FINDINGS

Patient is slightly rotated. The cardiomediastinal silhouette is within normal limits allowing for
mild rotation. No infiltrates are seen. Bony thorax is unremarkable.

IMPRESSION

No evidence for active disease in the chest.

Tech Notes:

chest pain

## 2017-10-11 ENCOUNTER — Encounter: Admit: 2017-10-11 | Discharge: 2017-10-11 | Payer: MEDICARE

## 2017-10-17 ENCOUNTER — Encounter: Admit: 2017-10-17 | Discharge: 2017-10-17 | Payer: MEDICARE

## 2017-10-17 ENCOUNTER — Ambulatory Visit: Admit: 2017-10-17 | Discharge: 2017-10-18 | Payer: MEDICARE

## 2017-10-17 DIAGNOSIS — E119 Type 2 diabetes mellitus without complications: ICD-10-CM

## 2017-10-17 DIAGNOSIS — E785 Hyperlipidemia, unspecified: ICD-10-CM

## 2017-10-17 DIAGNOSIS — I10 Essential (primary) hypertension: ICD-10-CM

## 2017-10-17 DIAGNOSIS — F329 Major depressive disorder, single episode, unspecified: Principal | ICD-10-CM

## 2017-10-17 DIAGNOSIS — R072 Precordial pain: Principal | ICD-10-CM

## 2017-10-17 DIAGNOSIS — E0590 Thyrotoxicosis, unspecified without thyrotoxic crisis or storm: ICD-10-CM

## 2017-10-17 DIAGNOSIS — Z6841 Body Mass Index (BMI) 40.0 and over, adult: ICD-10-CM

## 2017-10-17 DIAGNOSIS — E782 Mixed hyperlipidemia: ICD-10-CM

## 2017-10-17 DIAGNOSIS — Z8249 Family history of ischemic heart disease and other diseases of the circulatory system: ICD-10-CM

## 2017-11-07 ENCOUNTER — Encounter: Admit: 2017-11-07 | Discharge: 2017-11-07 | Payer: MEDICARE

## 2017-11-07 ENCOUNTER — Ambulatory Visit: Admit: 2017-11-07 | Discharge: 2017-11-07 | Payer: MEDICARE

## 2017-11-07 DIAGNOSIS — Z6841 Body Mass Index (BMI) 40.0 and over, adult: ICD-10-CM

## 2017-11-07 DIAGNOSIS — E782 Mixed hyperlipidemia: ICD-10-CM

## 2017-11-07 DIAGNOSIS — Z8249 Family history of ischemic heart disease and other diseases of the circulatory system: ICD-10-CM

## 2017-11-07 DIAGNOSIS — R072 Precordial pain: Principal | ICD-10-CM

## 2017-11-07 MED ORDER — DOBUTAMINE IN D5W 250 MG/250 ML (1 MG/ML) IV SOLP
1-50 ug/kg/min | Freq: Once | INTRAVENOUS | 0 refills | Status: CP
Start: 2017-11-07 — End: ?
  Administered 2017-11-07: 19:00:00 50 ug/kg/min via INTRAVENOUS

## 2017-11-07 MED ORDER — METOPROLOL TARTRATE 5 MG/5 ML IV SOLN
5 mg | Freq: Once | INTRAVENOUS | 0 refills | Status: AC | PRN
Start: 2017-11-07 — End: ?

## 2017-11-07 MED ORDER — ATROPINE 0.1 MG/ML IJ SYRG
.25 mg | INTRAVENOUS | 0 refills | Status: DC | PRN
Start: 2017-11-07 — End: 2017-11-12

## 2017-11-07 MED ORDER — BENZOCAINE-MENTHOL 6-10 MG MM LOZG
1 | Freq: Once | ORAL | 0 refills | Status: AC | PRN
Start: 2017-11-07 — End: ?

## 2017-11-07 MED ORDER — PERFLUTREN LIPID MICROSPHERES 1.1 MG/ML IV SUSP
1-20 mL | Freq: Once | INTRAVENOUS | 0 refills | Status: CP | PRN
Start: 2017-11-07 — End: ?
  Administered 2017-11-07: 21:00:00 4 mL via INTRAVENOUS

## 2017-11-07 MED ORDER — SODIUM CHLORIDE 0.9 % IV SOLP
250 mL | INTRAVENOUS | 0 refills | Status: DC | PRN
Start: 2017-11-07 — End: 2017-11-12
  Administered 2017-11-07: 20:00:00 250 mL via INTRAVENOUS

## 2017-11-15 ENCOUNTER — Encounter: Admit: 2017-11-15 | Discharge: 2017-11-15 | Payer: MEDICARE

## 2017-11-18 ENCOUNTER — Encounter: Admit: 2017-11-18 | Discharge: 2017-11-18 | Payer: MEDICARE

## 2017-12-17 ENCOUNTER — Encounter: Admit: 2017-12-17 | Discharge: 2017-12-17 | Payer: MEDICARE

## 2017-12-17 ENCOUNTER — Ambulatory Visit: Admit: 2017-12-17 | Discharge: 2017-12-18 | Payer: MEDICARE

## 2017-12-17 DIAGNOSIS — Z8249 Family history of ischemic heart disease and other diseases of the circulatory system: ICD-10-CM

## 2017-12-17 DIAGNOSIS — F329 Major depressive disorder, single episode, unspecified: Principal | ICD-10-CM

## 2017-12-17 DIAGNOSIS — R072 Precordial pain: ICD-10-CM

## 2017-12-17 DIAGNOSIS — E782 Mixed hyperlipidemia: Principal | ICD-10-CM

## 2017-12-17 DIAGNOSIS — E119 Type 2 diabetes mellitus without complications: ICD-10-CM

## 2017-12-17 DIAGNOSIS — E0590 Thyrotoxicosis, unspecified without thyrotoxic crisis or storm: ICD-10-CM

## 2017-12-17 DIAGNOSIS — E669 Obesity, unspecified: ICD-10-CM

## 2017-12-17 DIAGNOSIS — I10 Essential (primary) hypertension: ICD-10-CM

## 2017-12-17 DIAGNOSIS — E785 Hyperlipidemia, unspecified: ICD-10-CM

## 2017-12-22 ENCOUNTER — Encounter: Admit: 2017-12-22 | Discharge: 2017-12-22 | Payer: MEDICARE

## 2017-12-22 DIAGNOSIS — E119 Type 2 diabetes mellitus without complications: ICD-10-CM

## 2017-12-22 DIAGNOSIS — E785 Hyperlipidemia, unspecified: ICD-10-CM

## 2017-12-22 DIAGNOSIS — F329 Major depressive disorder, single episode, unspecified: Principal | ICD-10-CM

## 2017-12-22 DIAGNOSIS — I10 Essential (primary) hypertension: ICD-10-CM

## 2017-12-22 DIAGNOSIS — E0590 Thyrotoxicosis, unspecified without thyrotoxic crisis or storm: ICD-10-CM

## 2018-09-04 IMAGING — CR ABDOMEN
3 series · 3 of 3 positions shown · non-contrast
Comparison: none

[abdomen supine kub (1 of 3)]
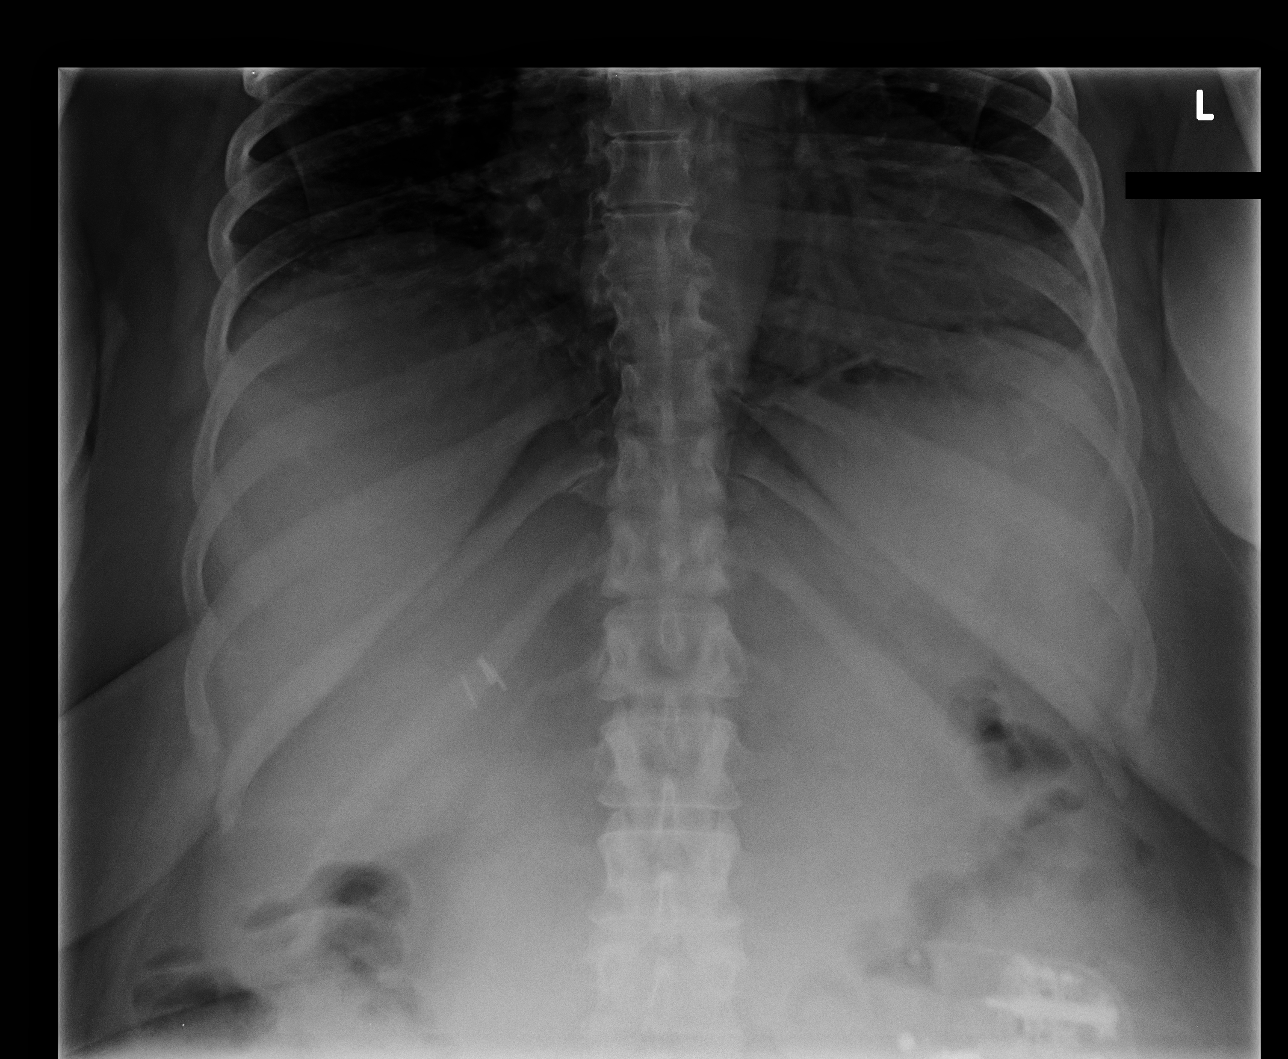

[abdomen supine kub (2 of 3)]
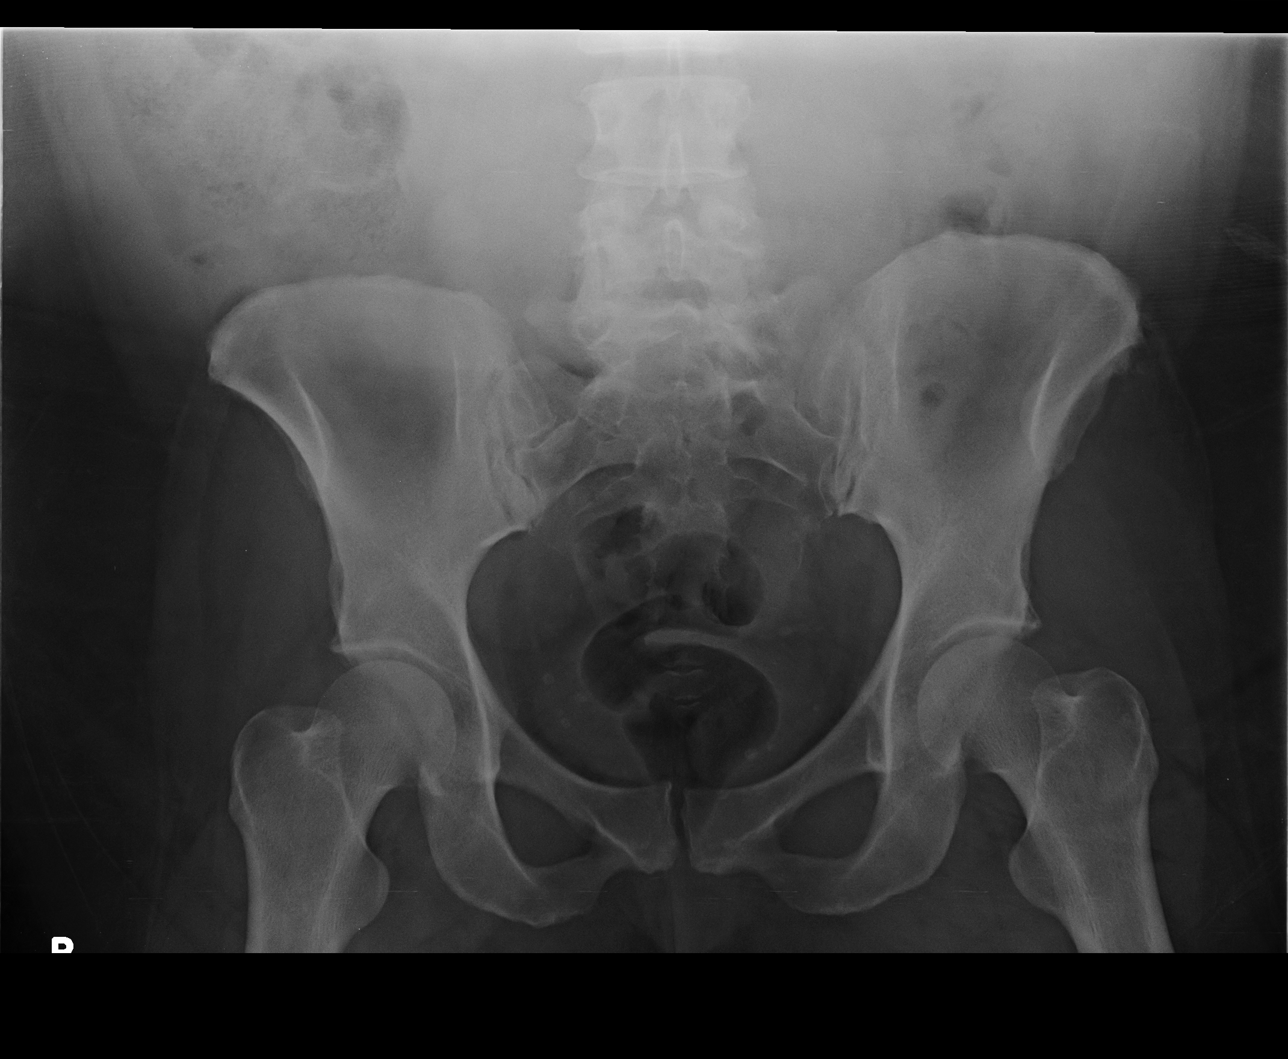

[abdomen supine kub (3 of 3)]
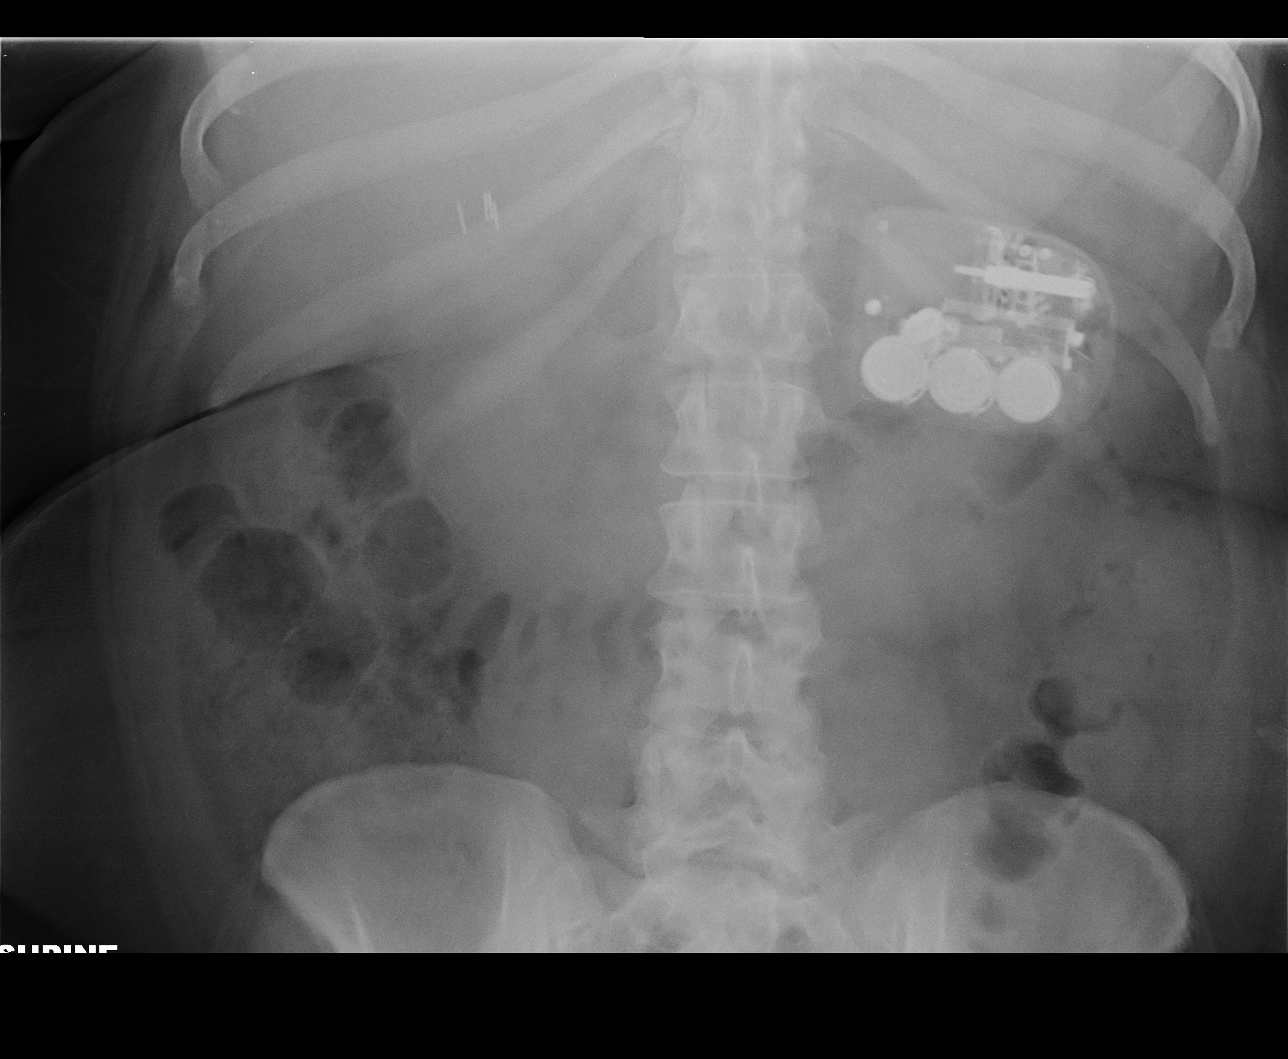

[3 of 3 positions shown; findings below may reference images not displayed]

XR abd 2v, upright and supine Sex:

EXAM
XR abd 2v, upright and supine

INDICATION
Constipation

TECHNIQUE
PA and Lateral views of the chest

COMPARISONS
CT abdomen pelvis 01/17/2017

FINDINGS
Fluid cystectomy toward nonspecific, nonobstructive bowel gas pattern. Mild amount of stool burden
within the ascending and descending colon. Overlying electronic device. No radiographic evidence of
pneumoperitoneum

IMPRESSION
1. Nonspecific, nonobstructive bowel gas pattern.
2. Mild stool burden.

Tech Notes:

flank pain that radiates around to the front, worse on the left
hx dm, gerd, chole, bladder surgery, insulin pump

## 2018-09-11 IMAGING — CR ABDOMEN
2 series · 2 of 2 positions shown · non-contrast
Comparison: none

[abd supine x-wise]
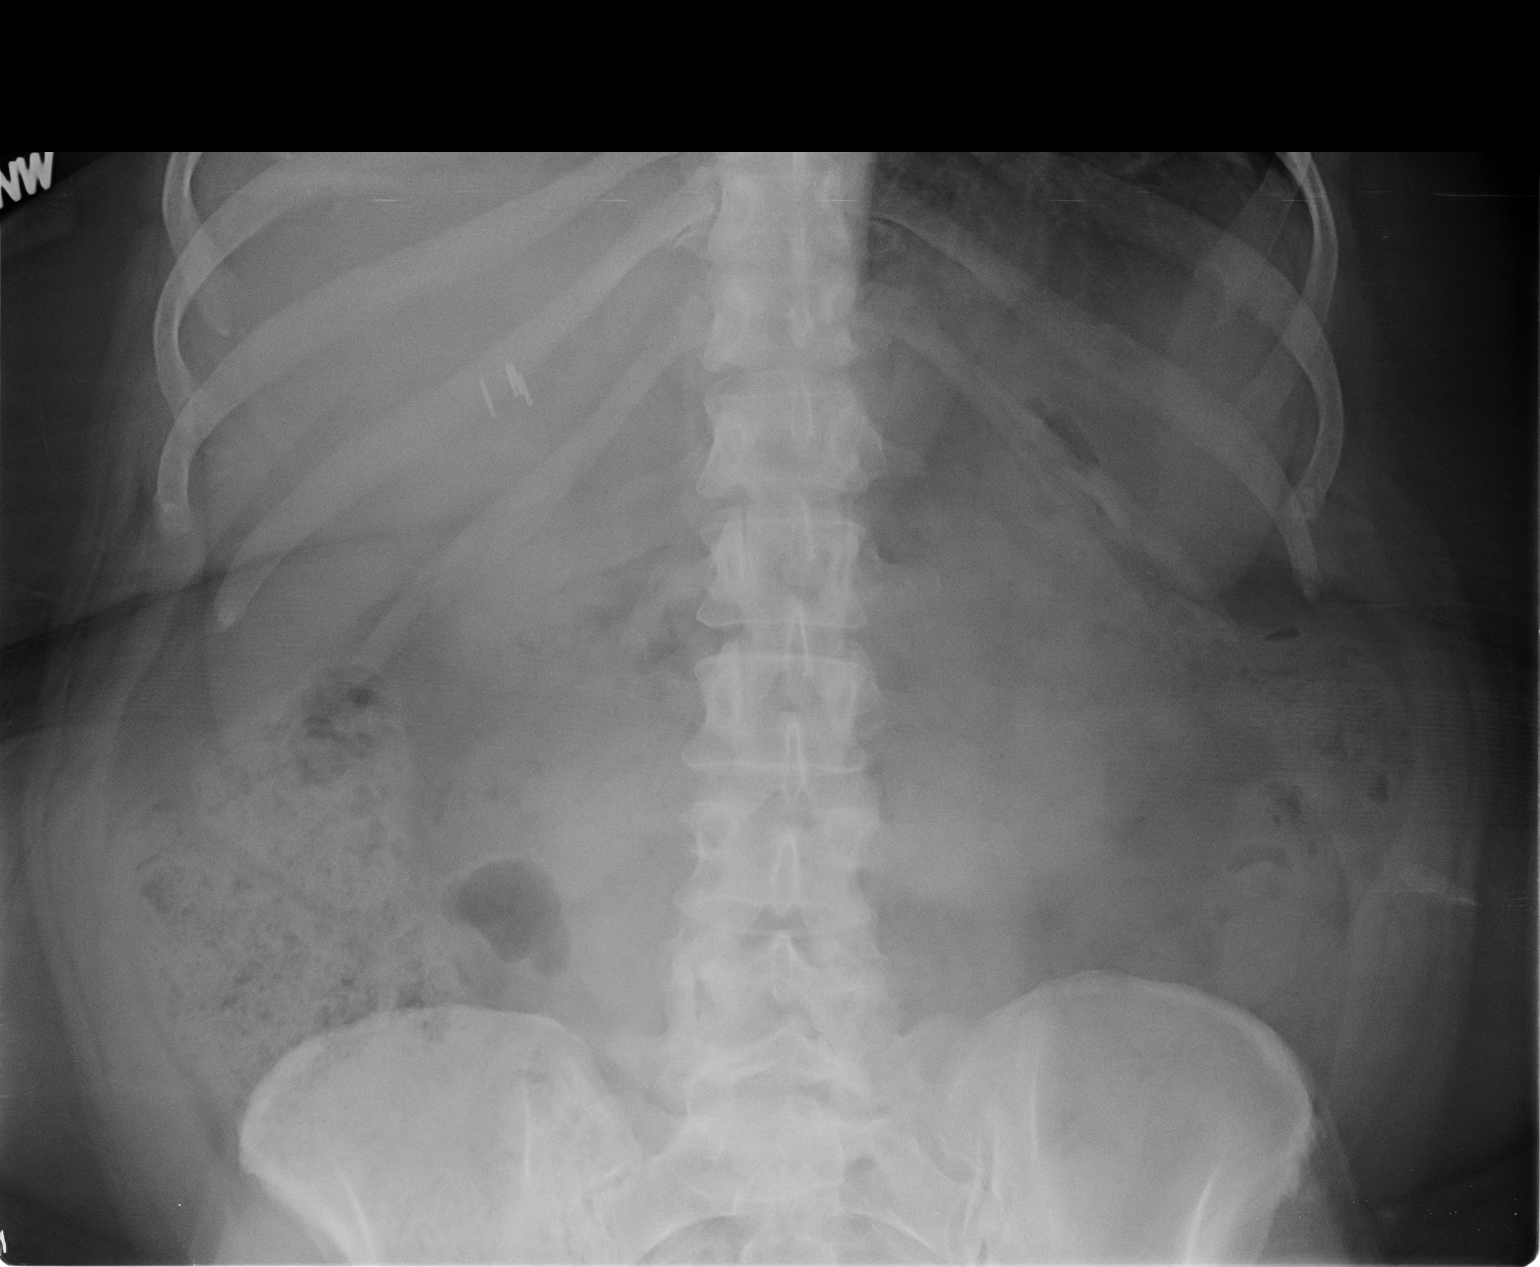

[abdomen supine kub]
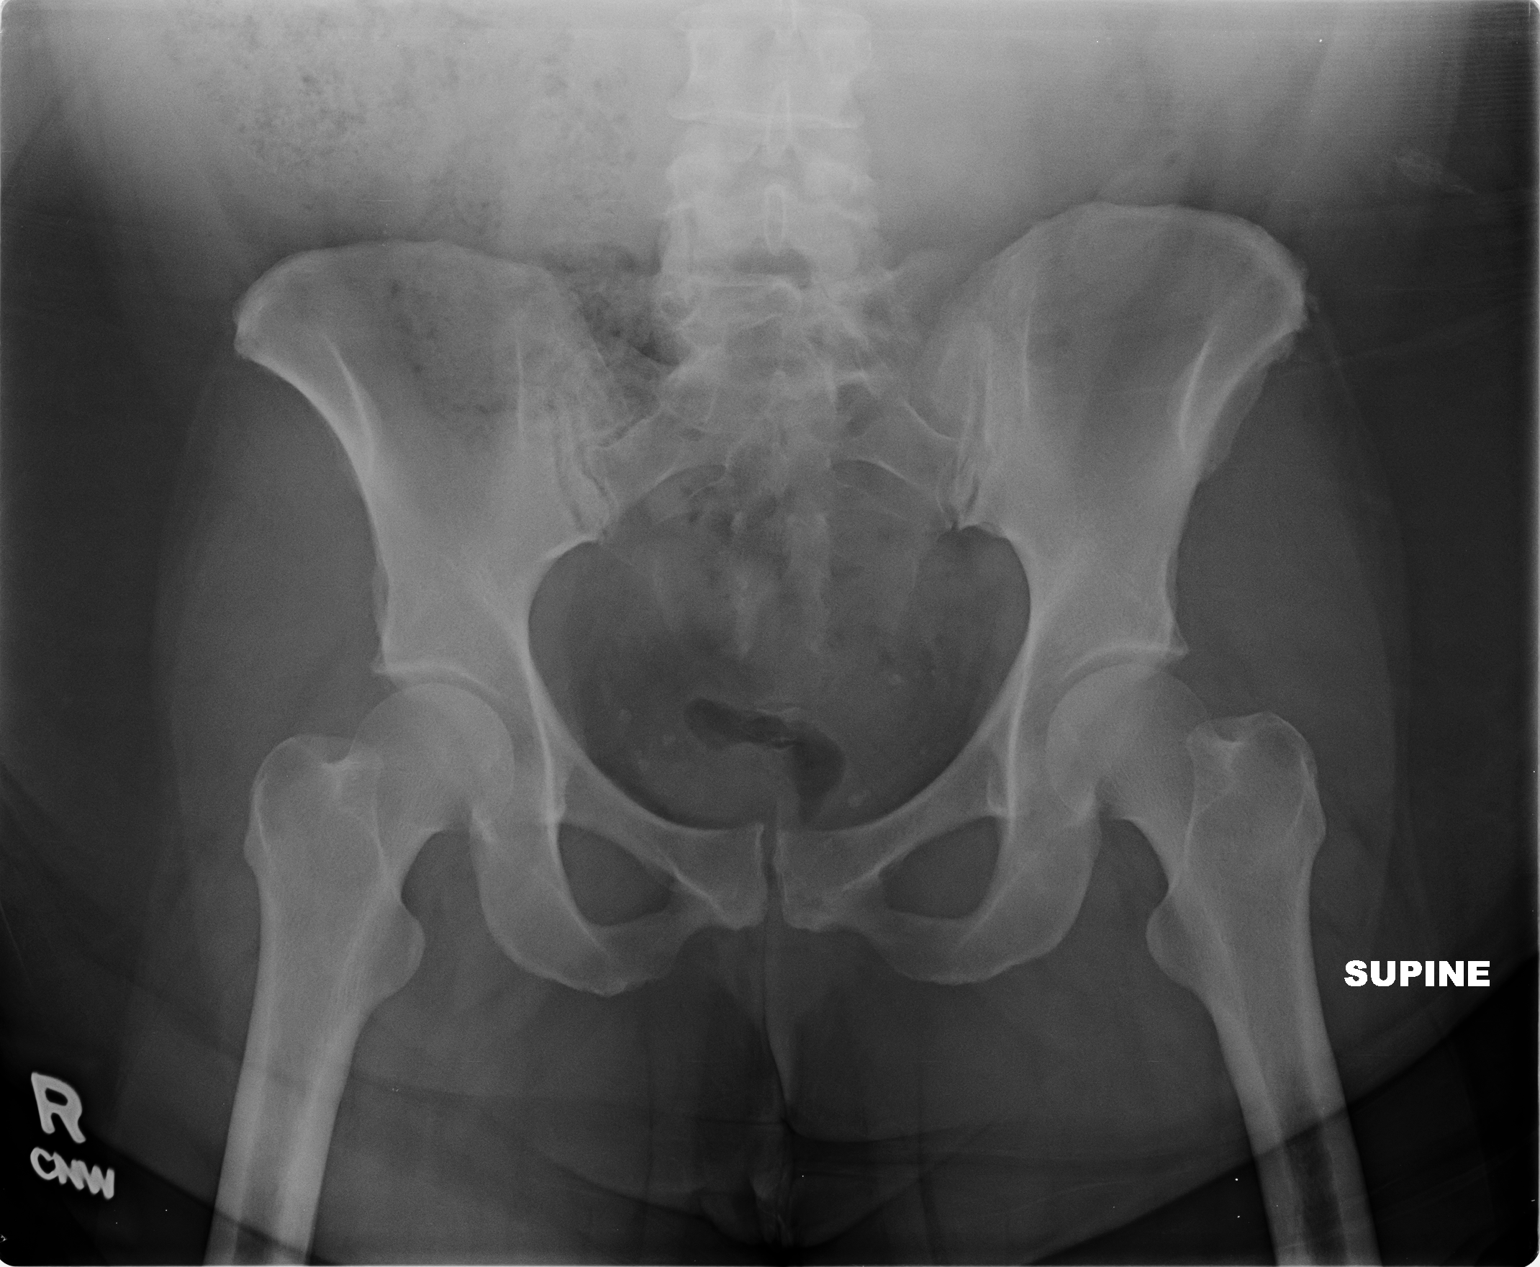

[2 of 2 positions shown; findings below may reference images not displayed]

EXAM

XR kub

INDICATION

abdominal pain
PT c/o constipation x 1 week and ABD pain x 2 months. VIERNES/SHADAE

TECHNIQUE

Single-view

COMPARISONS

September 04, 2018

FINDINGS

Nonobstructive bowel gas pattern. Moderate volume of colonic stool. No acute osseous findings.
Right upper quadrant surgical clips.

IMPRESSION

Nonobstructive bowel gas pattern. Moderate volume of colonic stool.

Tech Notes:

PT c/o constipation x 1 week and ABD pain x 2 months. VIERNES/SHADAE

## 2019-03-31 IMAGING — MG MAMMOGRAM 3D SCREEN, BILATERAL
10 of 16 series · 10 of 16 positions shown · non-contrast
Comparison: none

[R CC (1 of 2)]
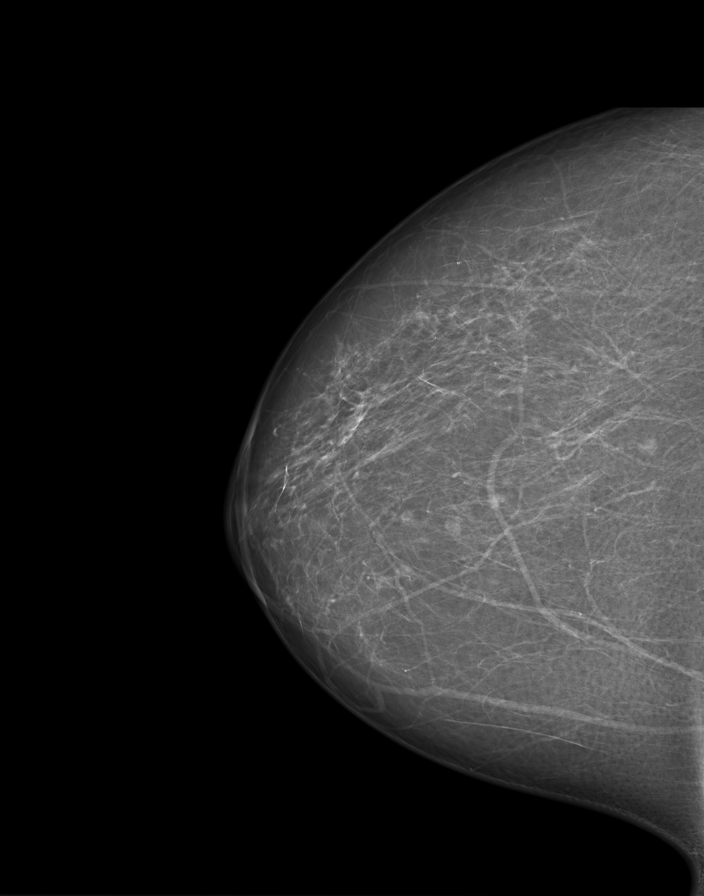

[R tomo (1 of 2)]
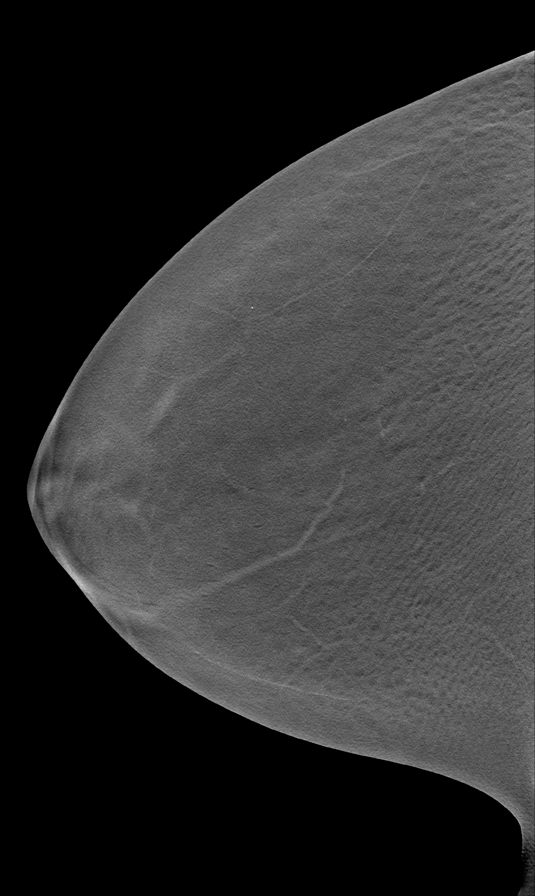

[R CC (2 of 2)]
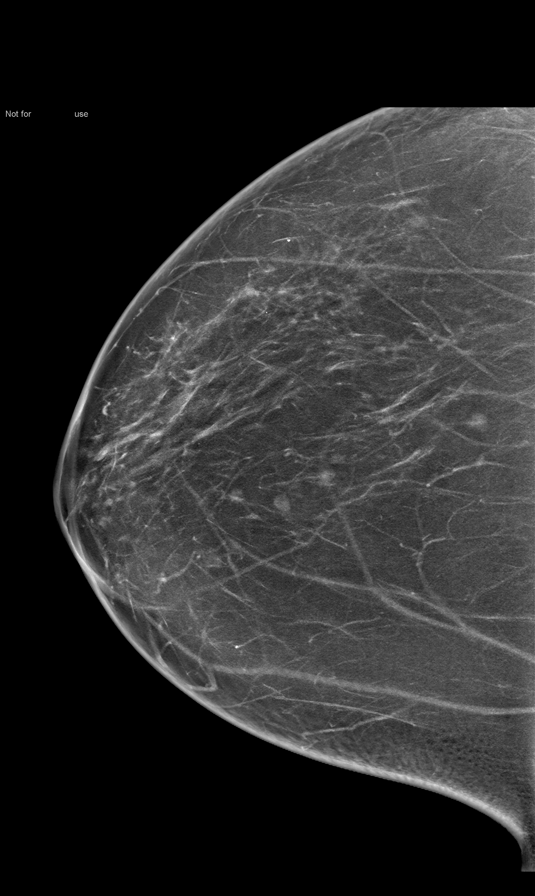

[R]
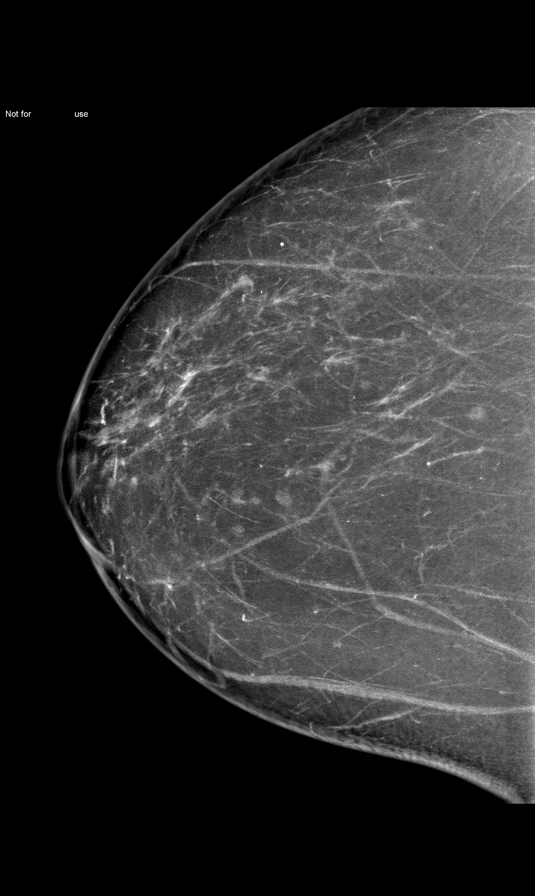

[L CC (1 of 2)]
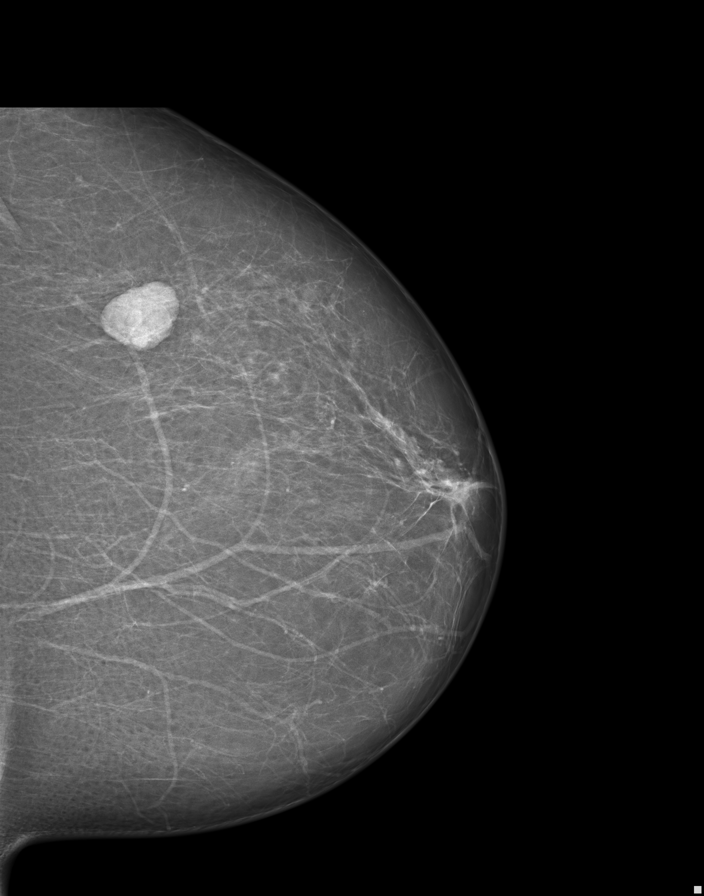

[L tomo]
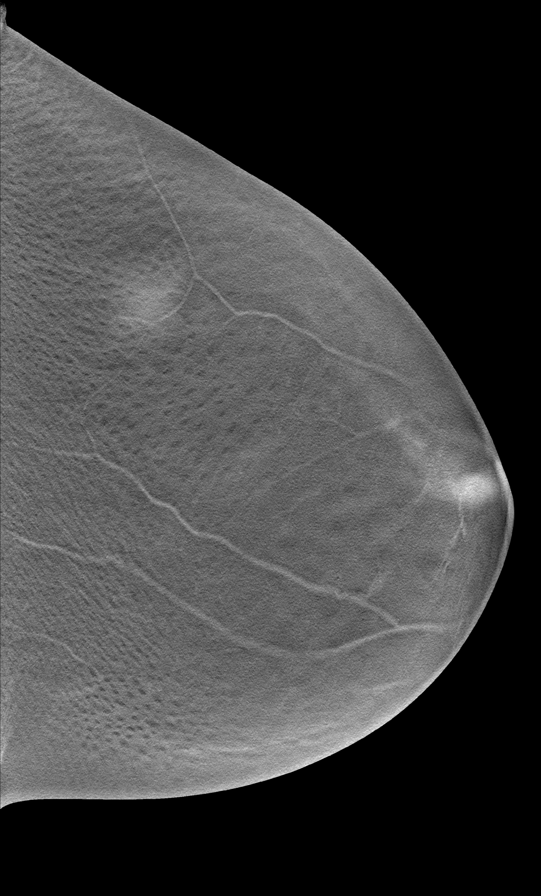

[L CC (2 of 2)]
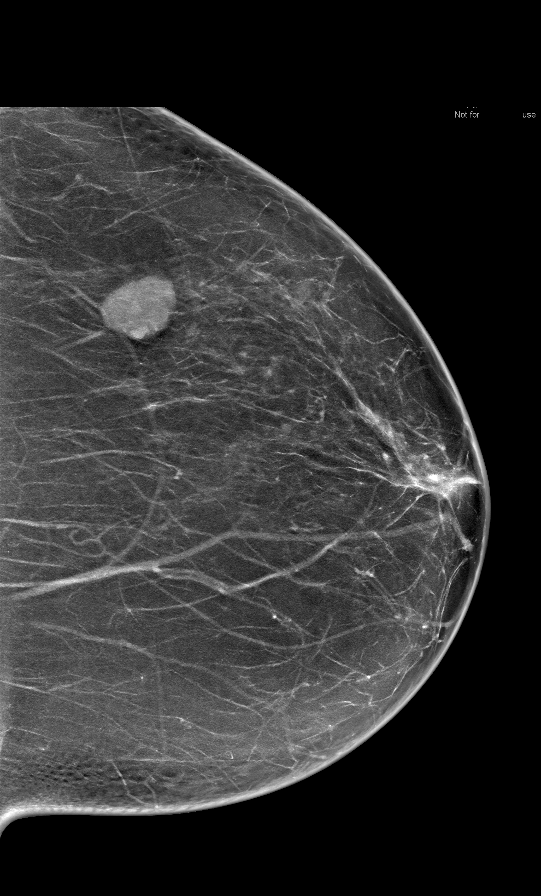

[L]
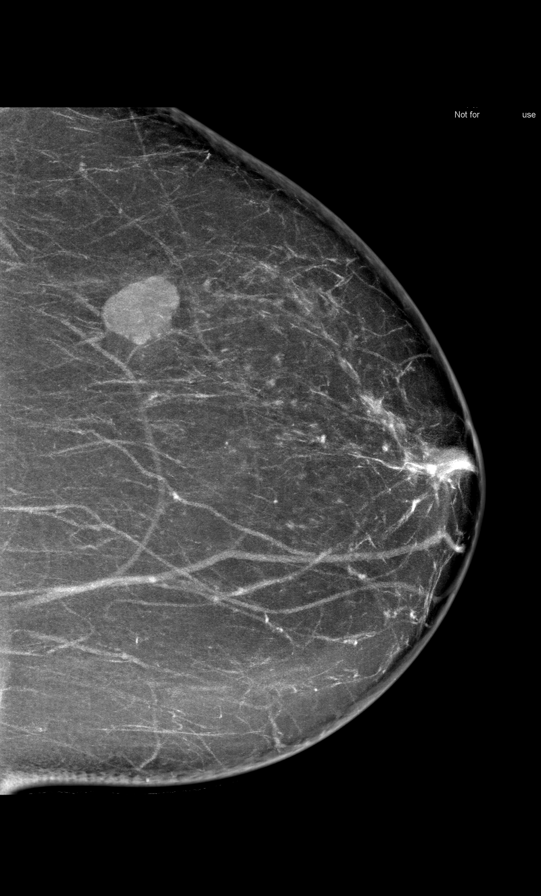

[R MLO]
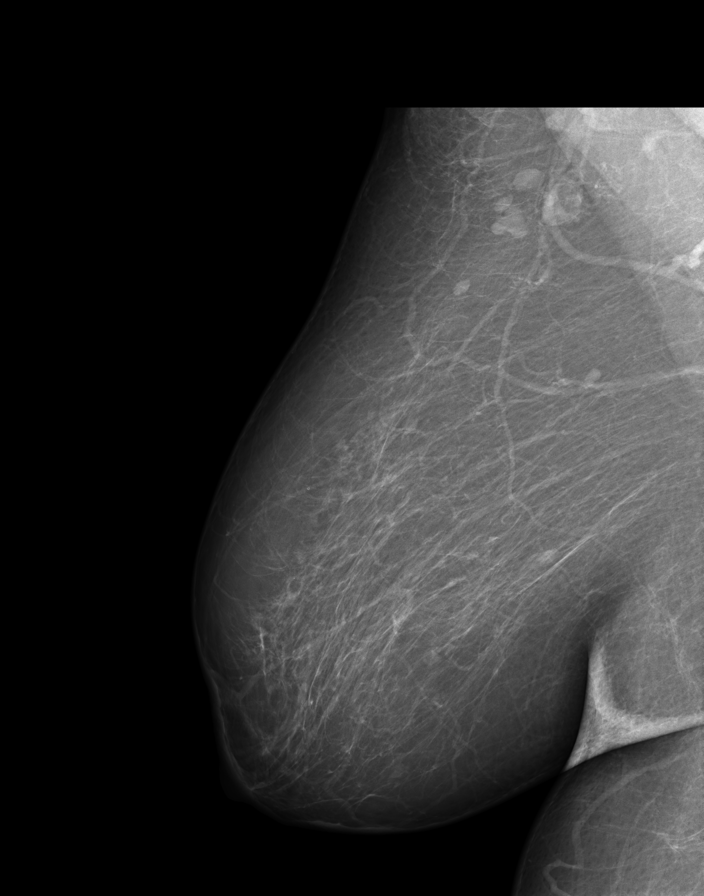

[R tomo (2 of 2)]
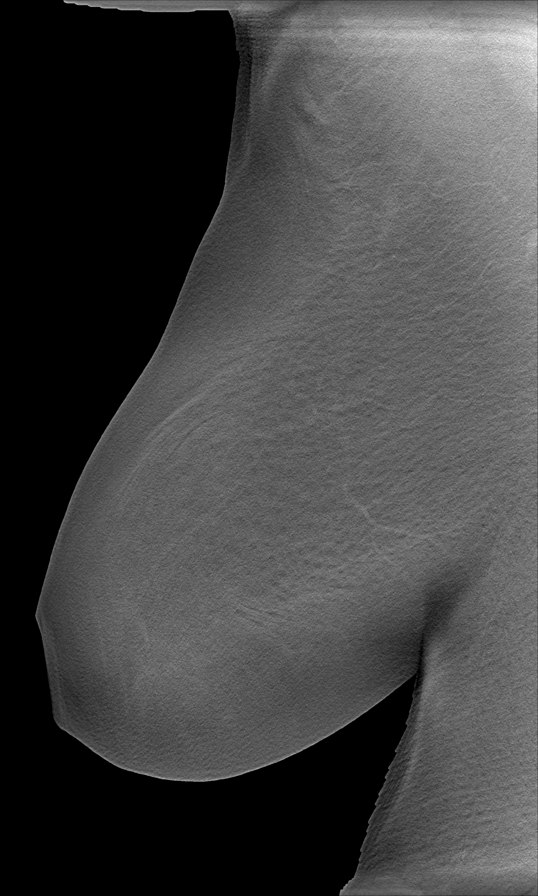

[10 of 16 positions shown; findings below may reference images not displayed]

EXAM

SCREENING MAMMOGRAM, BILATERAL

INDICATION

screening.
SCR 3D. LM

TECHNIQUE

Bilateral craniocaudal and medial lateral oblique views were generated and reviewed with computer-
aided detection.

COMPARISONS

09/20/2016, 08/26/2015

FINDINGS

The breasts are fatty replaced. There is increased size of the circumscribed dense left breast mass
in the posterior depth upper outer quadrant. No suspicious microcalcifications

IMPRESSION

BI-RADS 0. Recommend ultrasound for increased size of the left breast circumscribed mass.

Tech Notes:

## 2019-04-03 IMAGING — US BREASTLT
1 series · 14 of 25 positions shown · non-contrast
Comparison: none

[Series 1: us breast left complete · 14 of 29 slices shown]
[im 1/29]
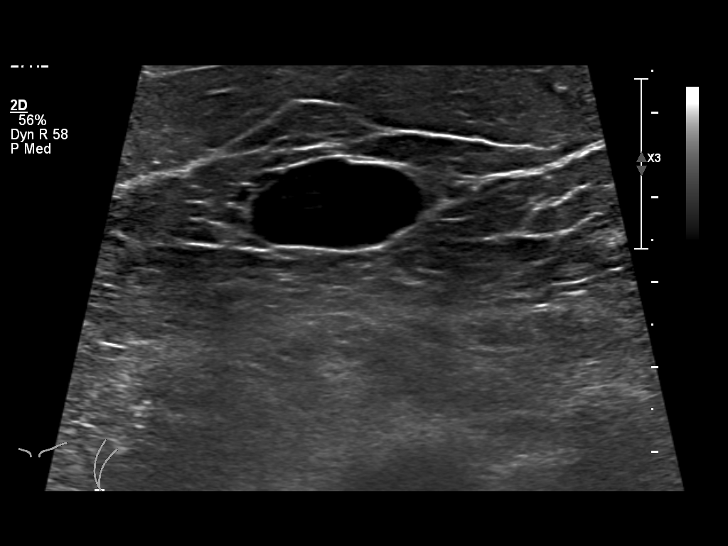
[im 3/29]
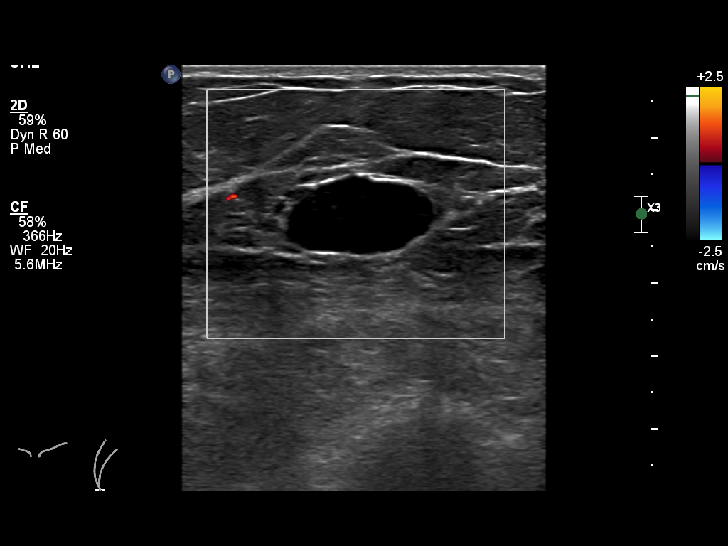
[im 5/29]
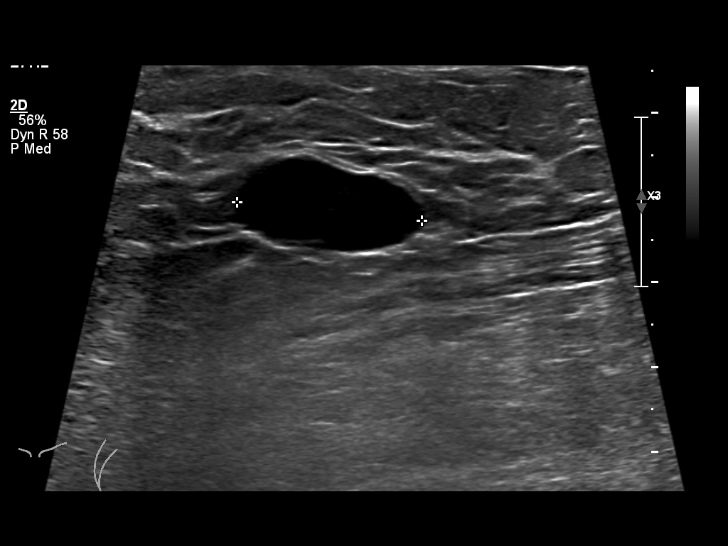
[im 8/29]
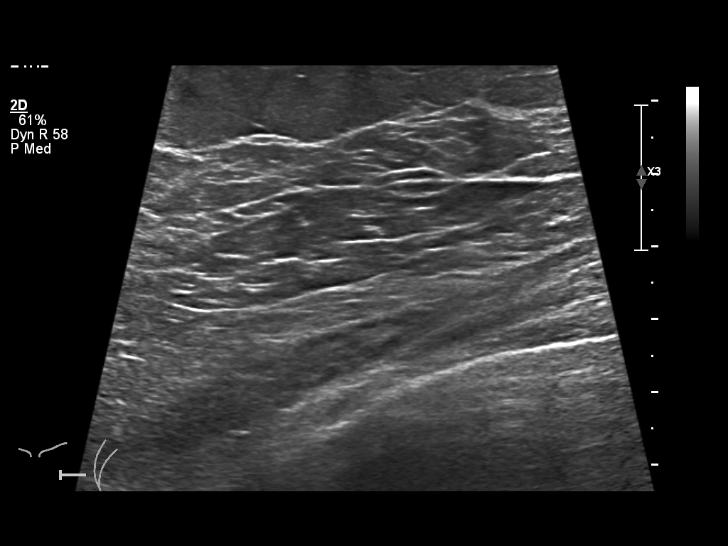
[im 10/29]
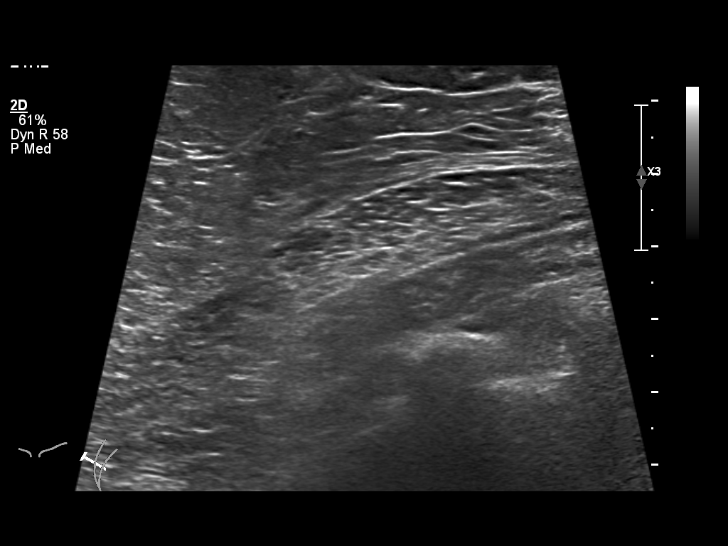
[im 11/29]
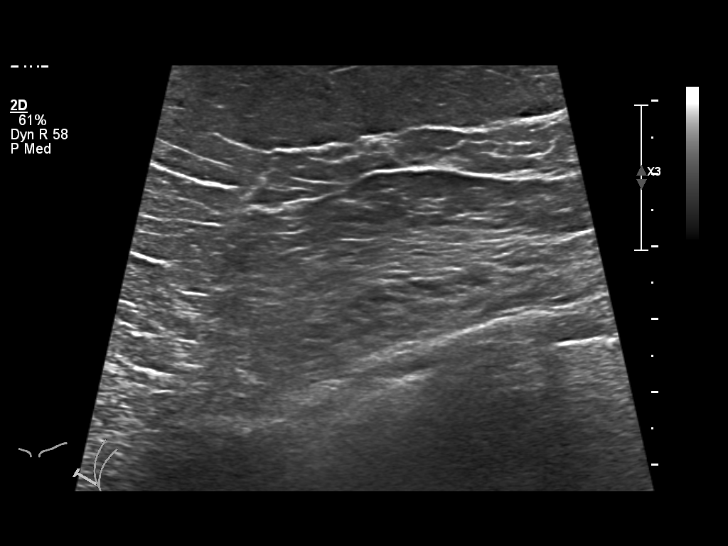
[im 13/29]
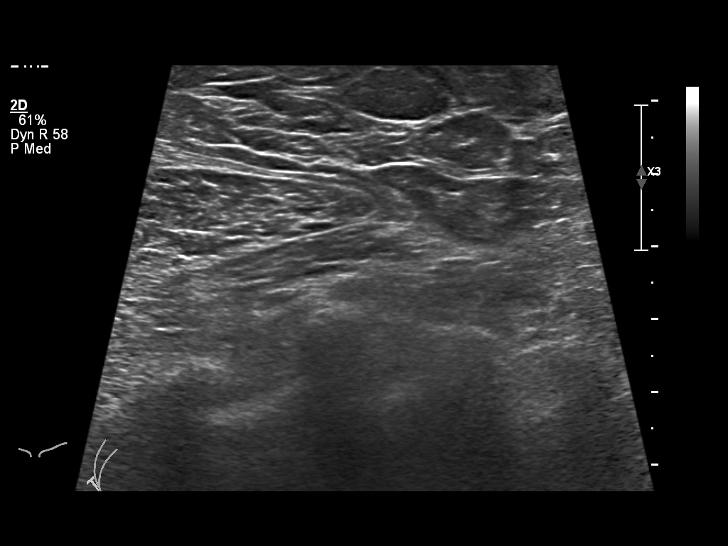
[im 16/29]
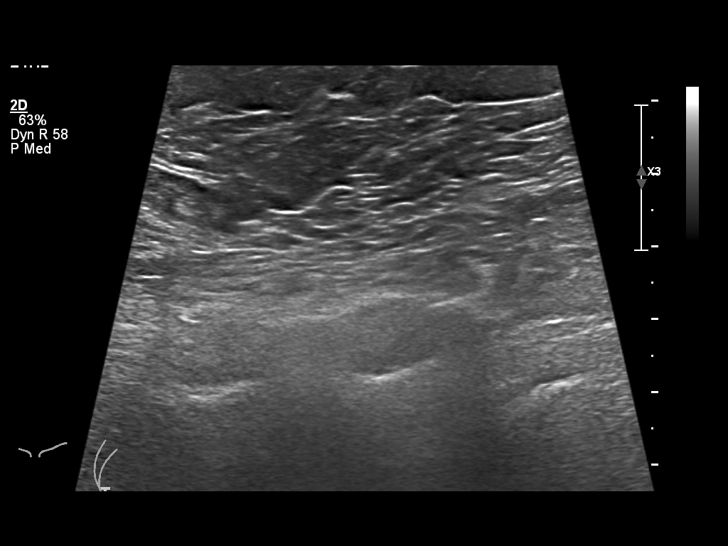
[im 18/29]
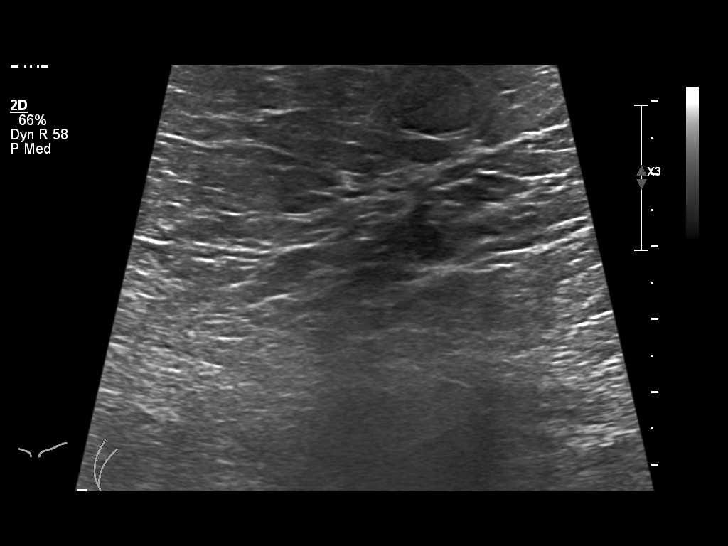
[im 19/29]
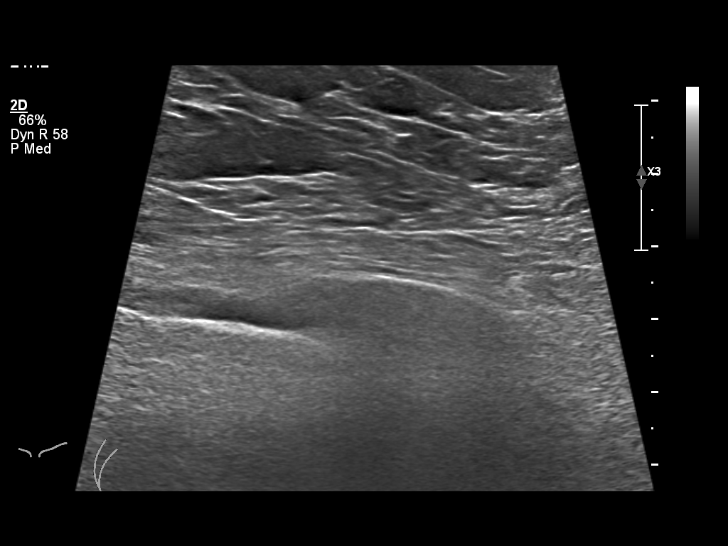
[im 22/29]
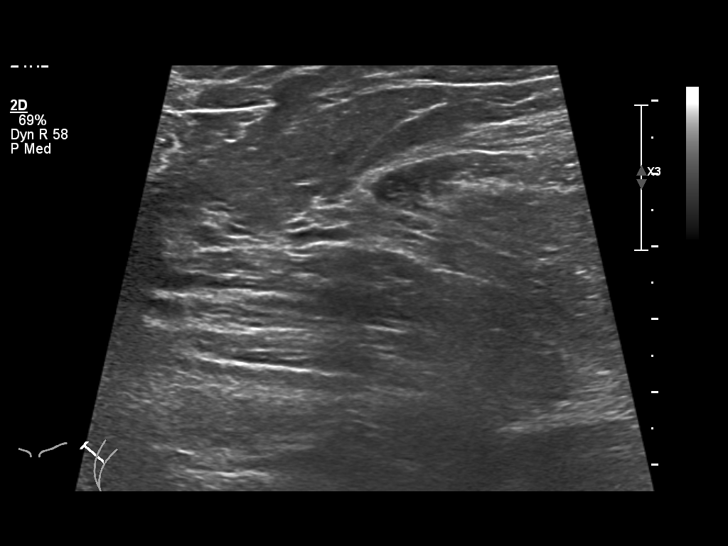
[im 24/29]
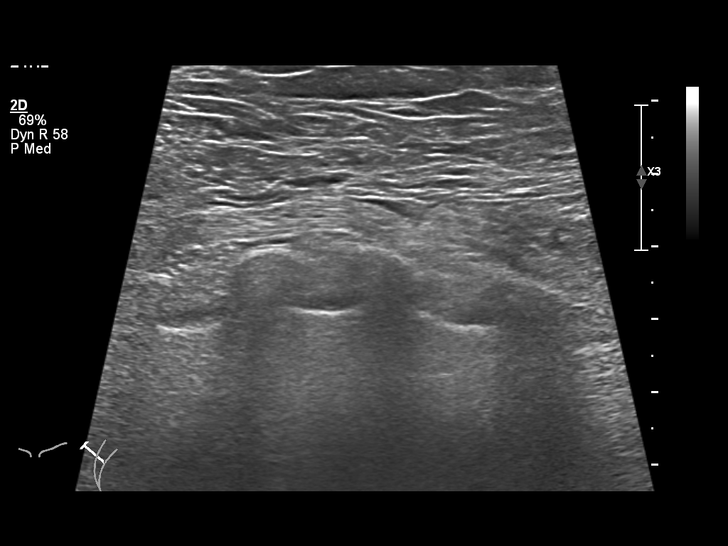
[im 26/29]
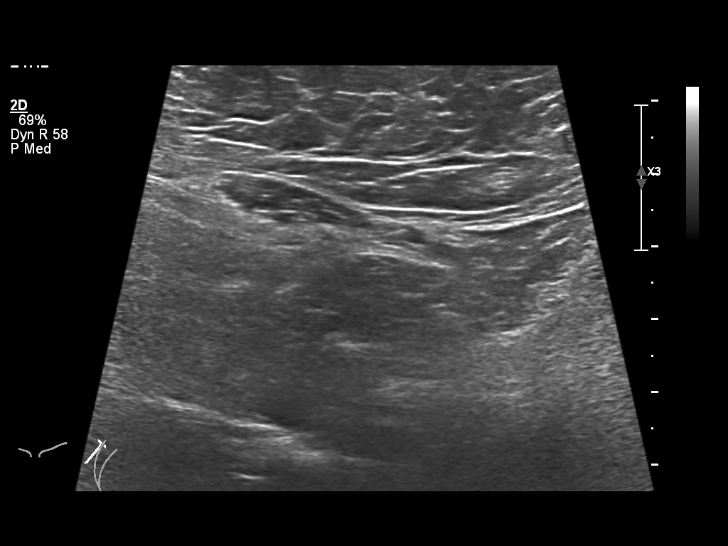
[im 29/29]
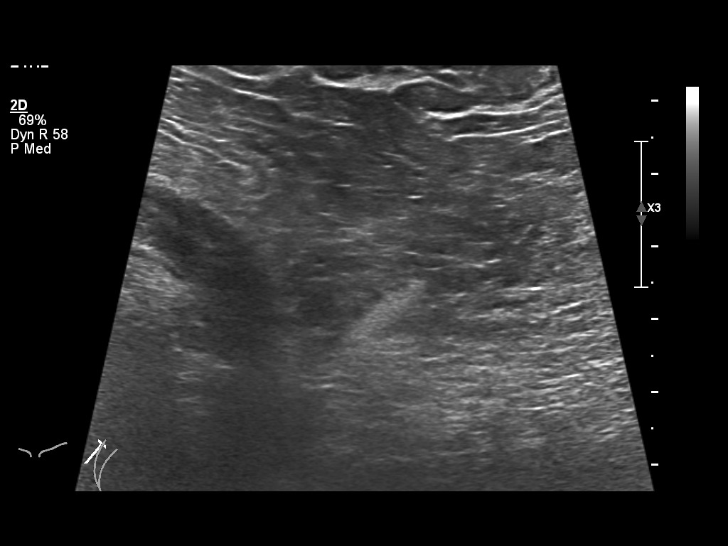

[14 of 25 positions shown; findings below may reference images not displayed]

EXAM

Left breast ultrasound

INDICATION

density left breast
abnormal mamm

FINDINGS

Ultrasound of the left breast was performed in compared to recent mammogram from 03/31/2019.

At the 3 o'clock position left breast, 10 cm from the nipple, there is a smoothly marginated cyst
measuring 2.1 x 1.1 x 2.2 cm in diameter. This corresponds with the density seen on the prior
mammogram.

IMPRESSION

There is benign appearing cyst in the lateral aspect of the left breast at the 3 o'clock position,
10 cm from the nipple. BI-RADS 2. Benign sonographic finding. Screening mammographic follow up in 12
months is recommended. A letter for follow-up scheduling will be sent.

Tech Notes:

abnormal mamm

## 2020-02-04 IMAGING — US TRANVAG
1 series · 14 of 25 positions shown · non-contrast
Comparison: none

[Series 1: us transvaginal non ob · 14 of 79 slices shown]
[im 1/79]
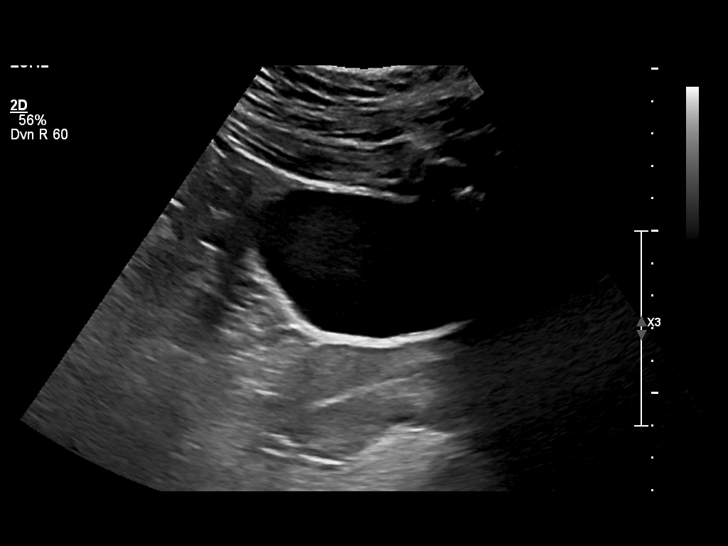
[im 7/79]
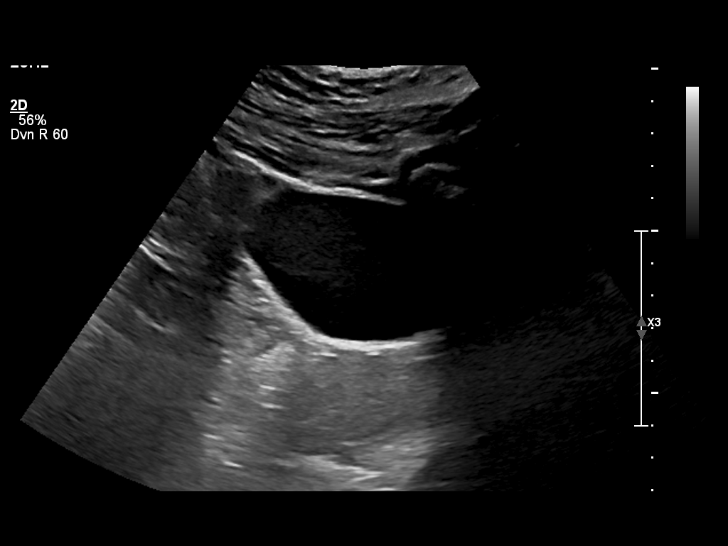
[im 14/79]
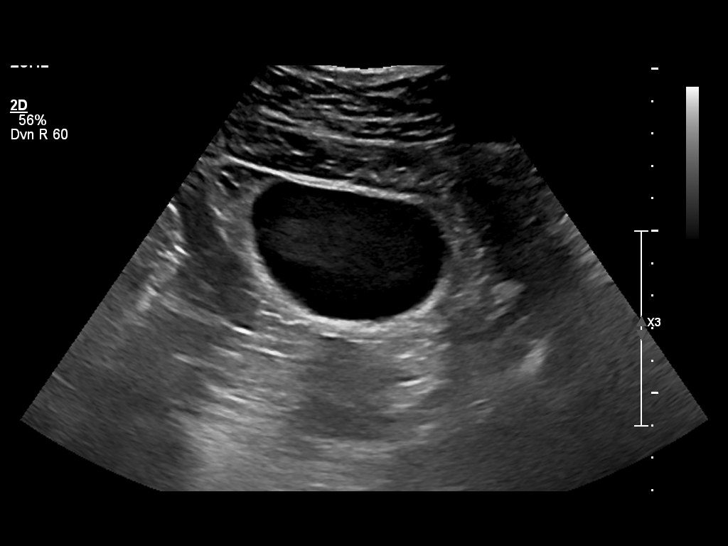
[im 20/79]
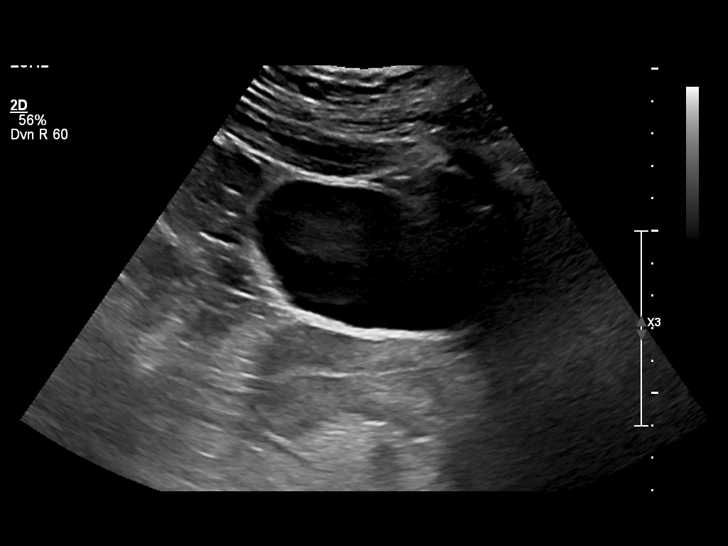
[im 27/79]
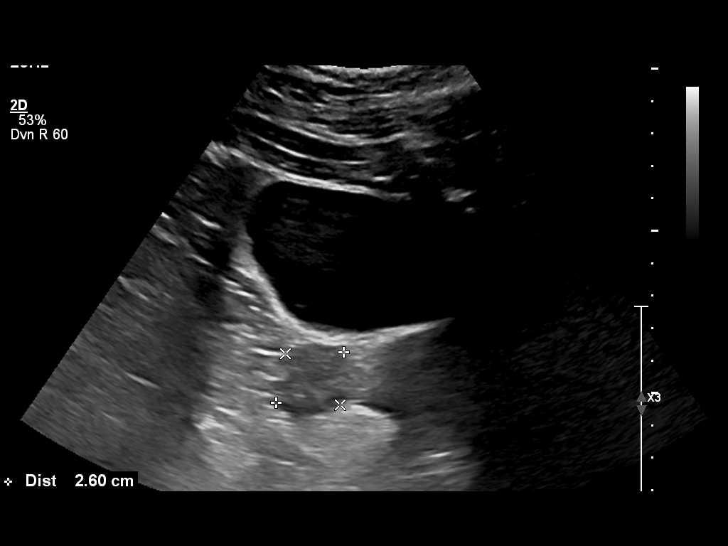
[im 30/79]
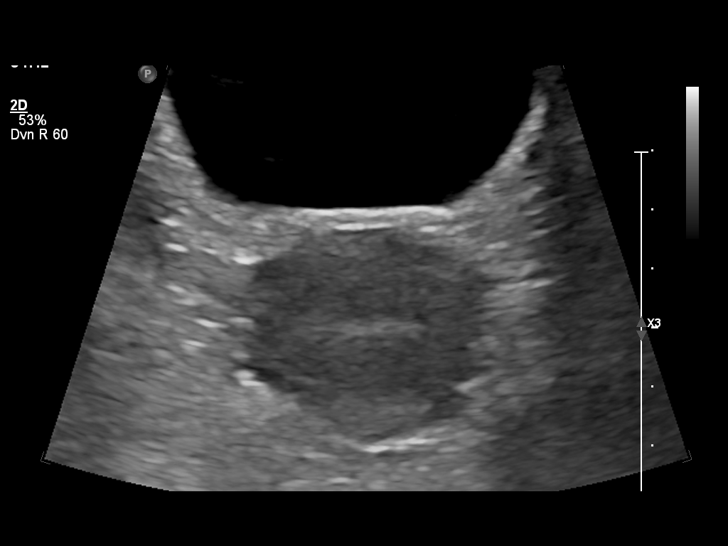
[im 36/79]
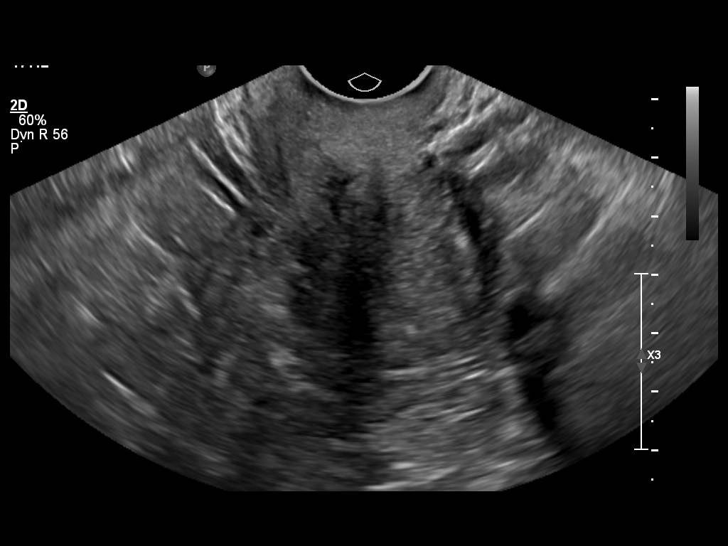
[im 43/79]
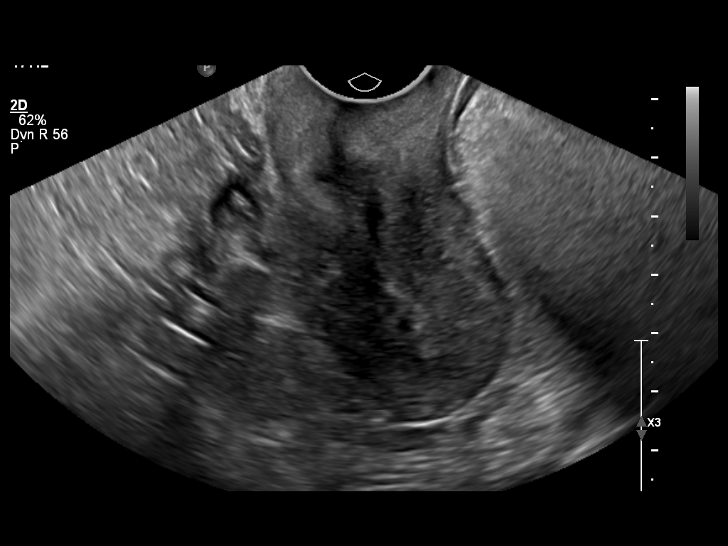
[im 49/79]
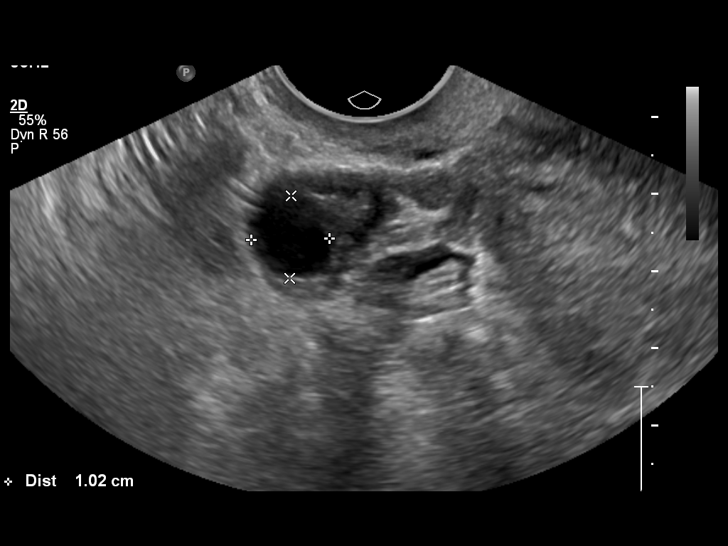
[im 53/79]
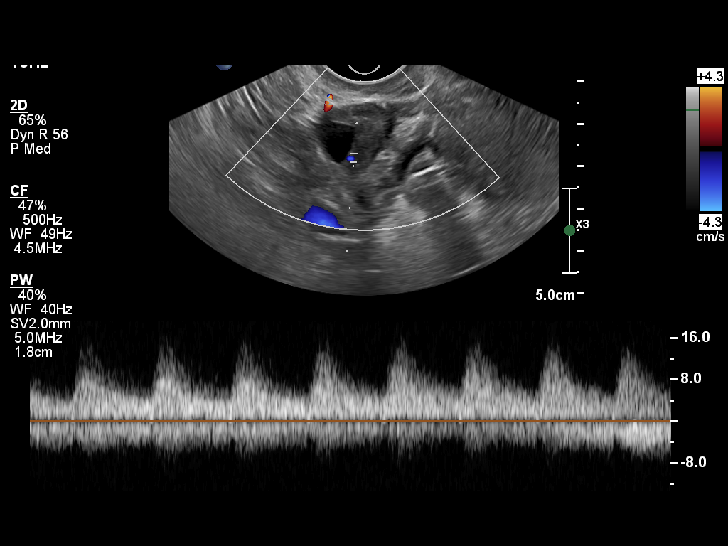
[im 59/79]
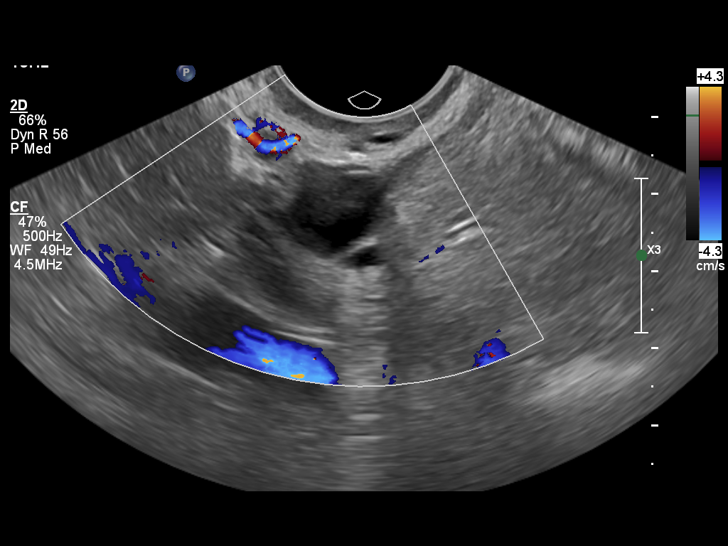
[im 66/79]
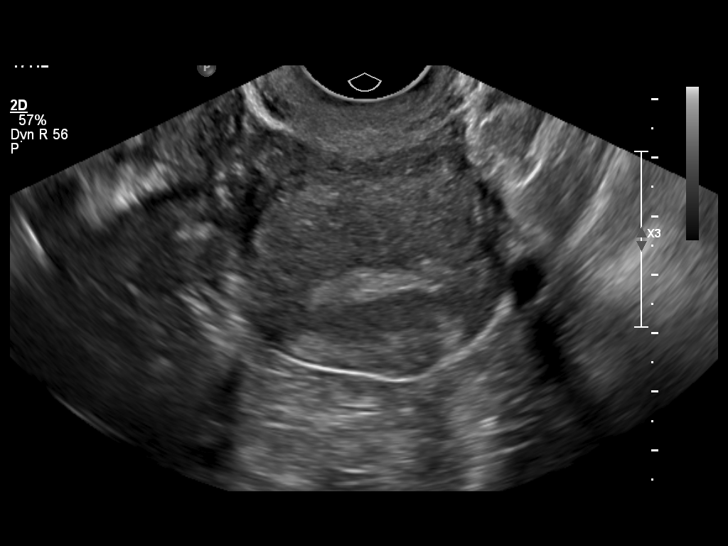
[im 72/79]
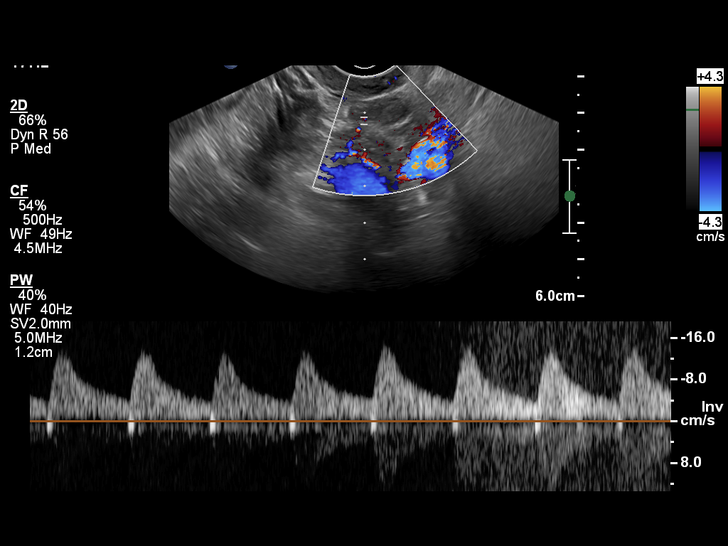
[im 79/79]
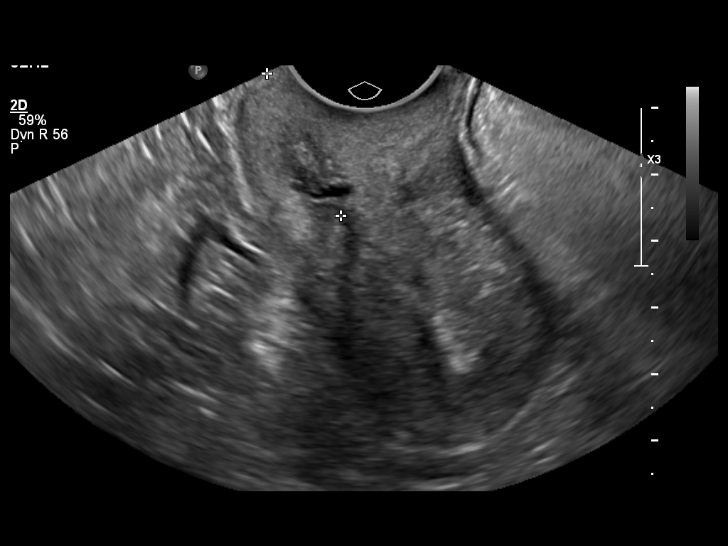

[14 of 25 positions shown; findings below may reference images not displayed]

DIAGNOSTIC STUDIES

EXAM

Pelvic ultrasound

INDICATION

bleeding
vaginal bleeding x3 days; pt states she hasnt a period in 15yrs; post menopausal

TECHNIQUE

Transabdominal and transvaginal scanning was obtained.

COMPARISONS

October 08, 2017

FINDINGS

Uterus measures 6.9 x 3.8 x 4.3 cm. Cervix measures 2.6 cm. Small nabothian cyst is noted.
Endometrium has a double wall thickness of 3 millimeters.

Right ovary measures 2.5 x 1.8 x 1.9 cm. There is a small 1.4 cm cyst on the right ovary.

Left ovary measures 1.5 x 0.5 x 0.7 cm.

IMPRESSION

Normal appearance of the uterus and endometrium. If bleeding persists, follow-up studies may be
necessary.

Small nabothian cysts and small right ovarian cyst.

Tech Notes:

vaginal bleeding x3 days; pt states she hasnt a period in 15yrs; post menopausal

## 2020-04-20 ENCOUNTER — Encounter: Admit: 2020-04-20 | Discharge: 2020-04-20 | Payer: MEDICARE

## 2020-04-20 NOTE — Telephone Encounter
Called patient to see if she had any new Hospital, Cardiologist or PCP records to order, she says she has not seen any new DR's or been Hospitalized anywhere, no records needed, clp

## 2020-04-26 ENCOUNTER — Encounter: Admit: 2020-04-26 | Discharge: 2020-04-26 | Payer: MEDICARE

## 2020-05-02 ENCOUNTER — Encounter: Admit: 2020-05-02 | Discharge: 2020-05-02 | Payer: MEDICARE

## 2020-05-02 DIAGNOSIS — I10 Essential (primary) hypertension: Secondary | ICD-10-CM

## 2020-05-02 DIAGNOSIS — Z8249 Family history of ischemic heart disease and other diseases of the circulatory system: Secondary | ICD-10-CM

## 2020-05-02 DIAGNOSIS — E1169 Type 2 diabetes mellitus with other specified complication: Secondary | ICD-10-CM

## 2020-05-02 DIAGNOSIS — E119 Type 2 diabetes mellitus without complications: Secondary | ICD-10-CM

## 2020-05-02 DIAGNOSIS — F32A Depression: Secondary | ICD-10-CM

## 2020-05-02 DIAGNOSIS — E785 Hyperlipidemia, unspecified: Secondary | ICD-10-CM

## 2020-05-02 DIAGNOSIS — E782 Mixed hyperlipidemia: Secondary | ICD-10-CM

## 2020-05-02 DIAGNOSIS — E0590 Thyrotoxicosis, unspecified without thyrotoxic crisis or storm: Secondary | ICD-10-CM

## 2020-05-02 MED ORDER — ROSUVASTATIN 20 MG PO TAB
20 mg | ORAL_TABLET | Freq: Every day | ORAL | 3 refills | 90.00000 days | Status: AC
Start: 2020-05-02 — End: ?

## 2020-05-02 NOTE — Patient Instructions
Stop Simvastatin 20mg  daily    Start Rosuvastatin 20mg  daily    Fasting labs in 3 months    Follow up in 8-22months

## 2020-05-23 IMAGING — CR RIBSLT
3 series · 3 of 3 positions shown · non-contrast
Comparison: none

[ribs ap upper]
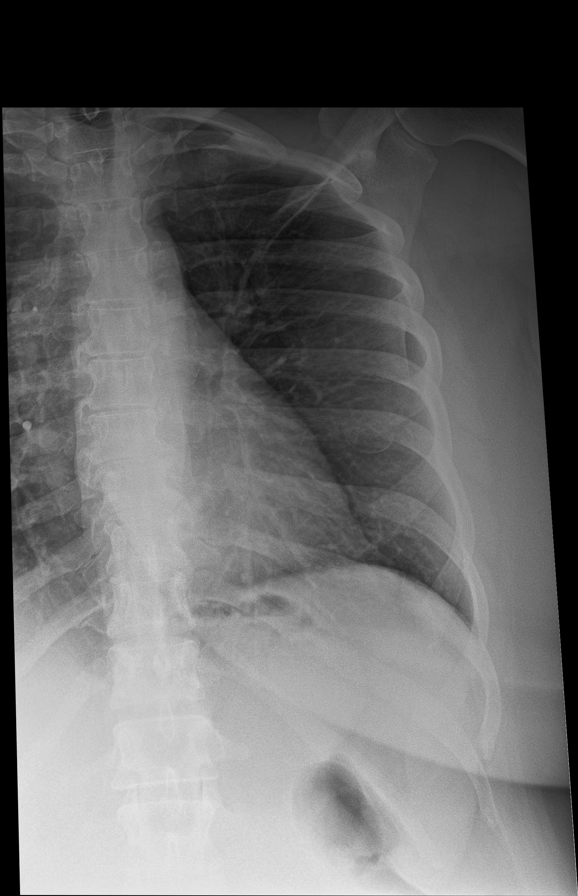

[ribs obl]
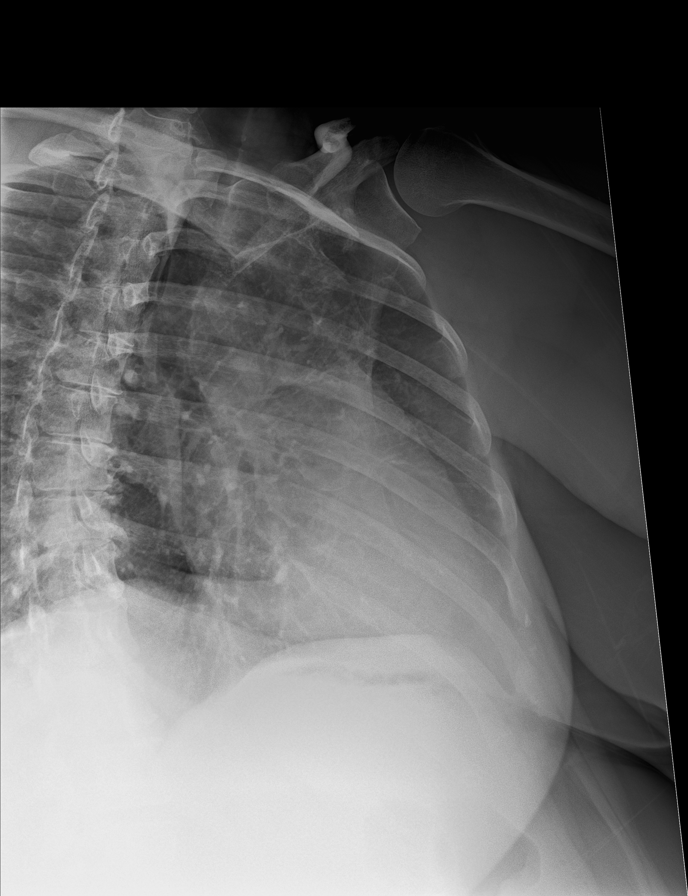

[ribs ap lower]
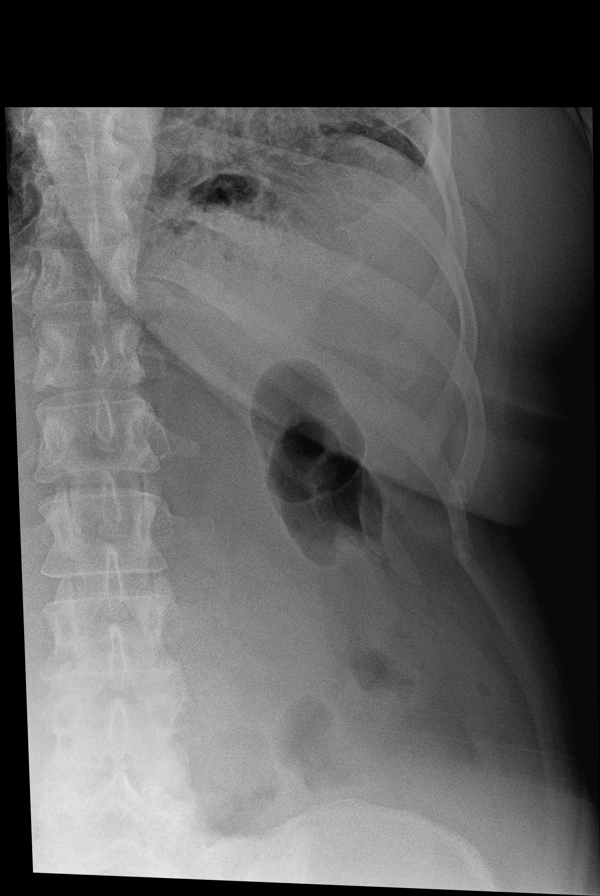

[3 of 3 positions shown; findings below may reference images not displayed]

DIAGNOSTIC STUDIES

EXAM

XR ribs, left

INDICATION

fall
FALL, PAIN TO LEFT LATERAL RIBS, WORSE WITH DEEP BREATHS.

TECHNIQUE

Detailed views left ribs obtained.

COMPARISONS

None available

FINDINGS

No fractures are seen.

IMPRESSION

No left rib fractures are noted.

Tech Notes:

FALL, PAIN TO LEFT LATERAL RIBS, WORSE WITH DEEP BREATHS.

## 2020-07-01 IMAGING — MR C-spine^Routine
6 series · 43 of 48 positions shown · non-contrast
Comparison: none

[Series 2: T2 · sagittal · 3.0mm · 0.54mm/px · 6 of 11 slices shown]
[im 1/11]
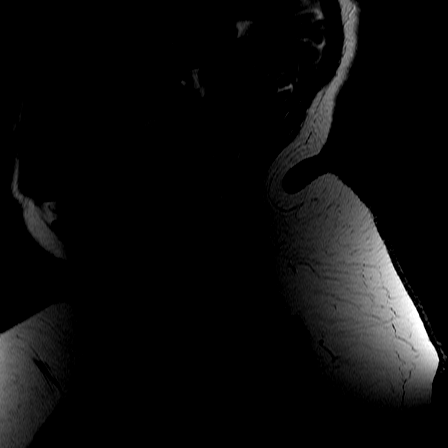
[im 3/11]
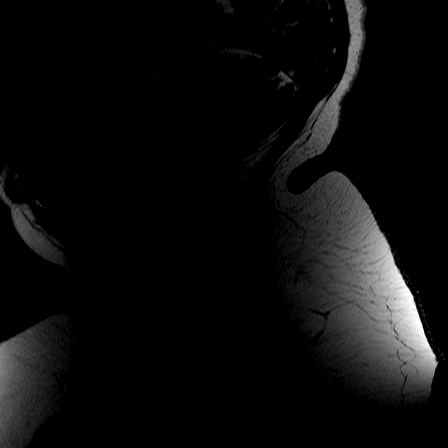
[im 5/11]
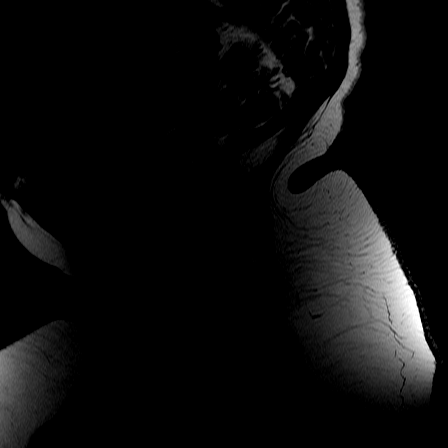
[im 7/11]
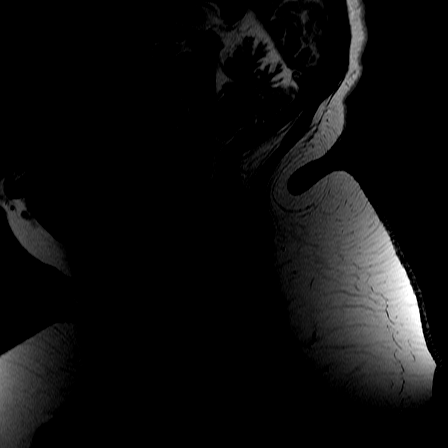
[im 9/11]
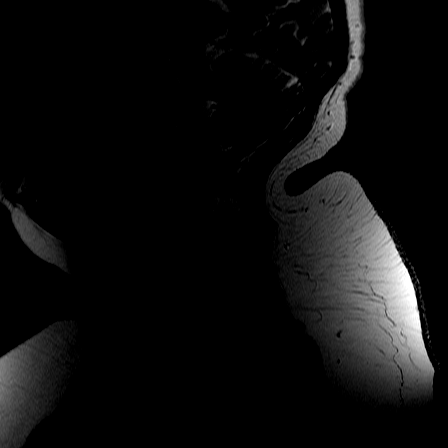
[im 11/11]
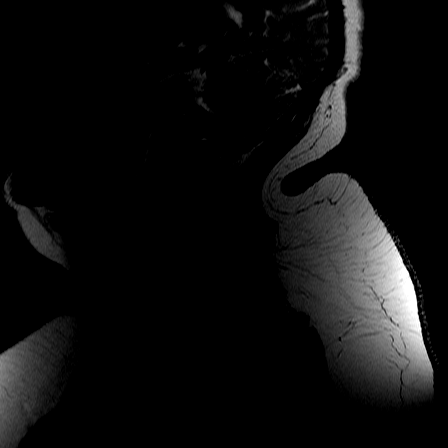

[Series 3: T1 · sagittal · 3.0mm · 0.75mm/px · 7 of 13 slices shown]
[im 1/13]
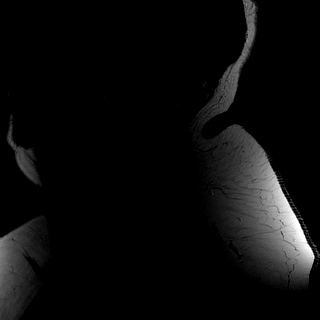
[im 3/13]
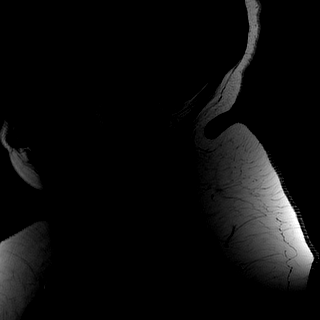
[im 5/13]
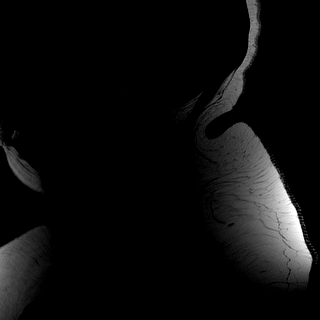
[im 7/13]
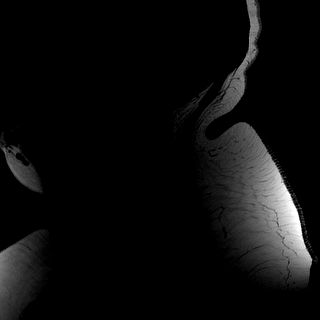
[im 9/13]
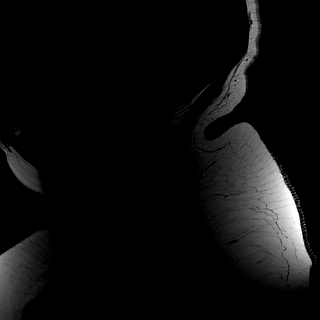
[im 11/13]
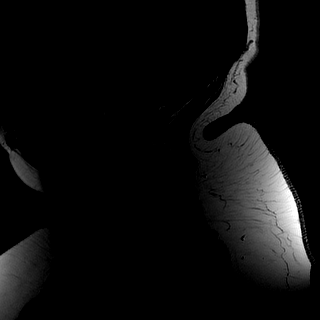
[im 13/13]
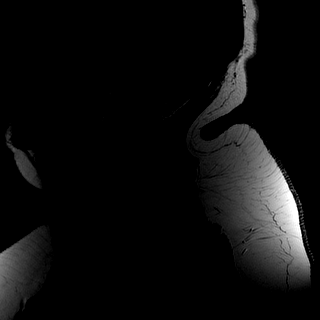

[Series 5: provider echo axial · axial · 3.0mm · 0.39mm/px · z∈[-49,+34]mm · 7 of 18 slices shown]
[im 1/18]
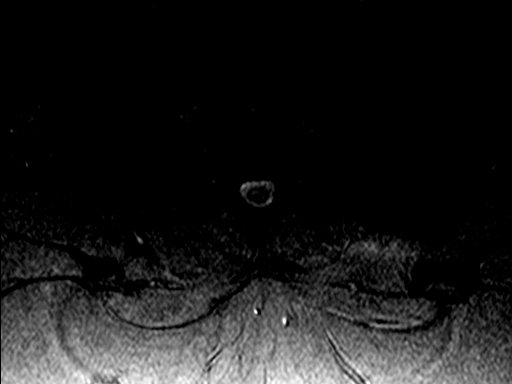
[im 2/18]
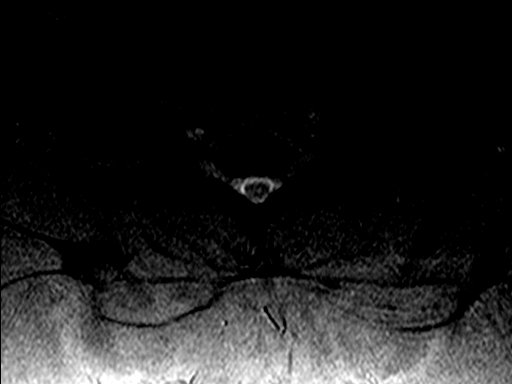
[im 6/18]
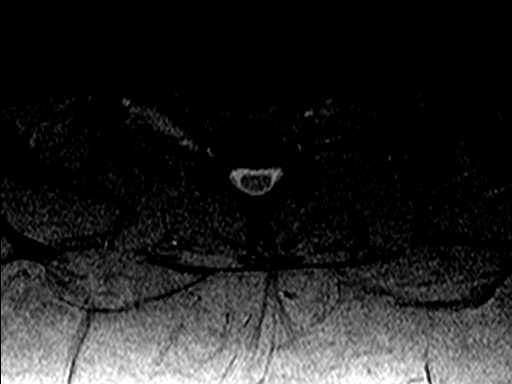
[im 8/18]
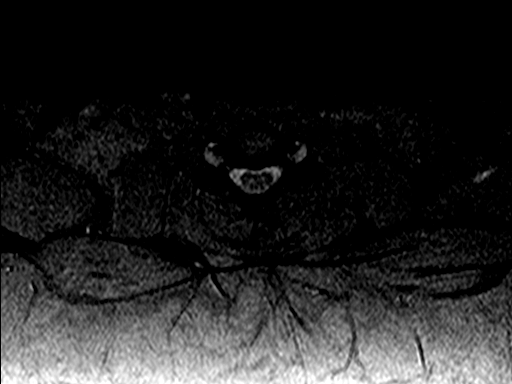
[im 10/18]
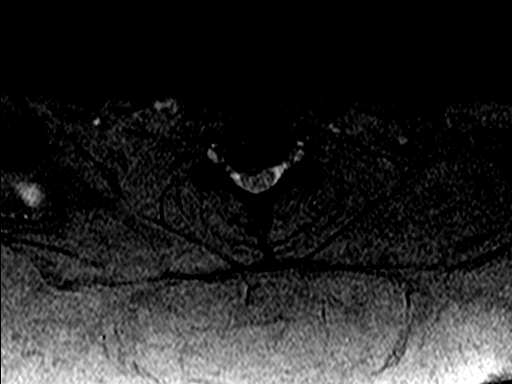
[im 12/18]
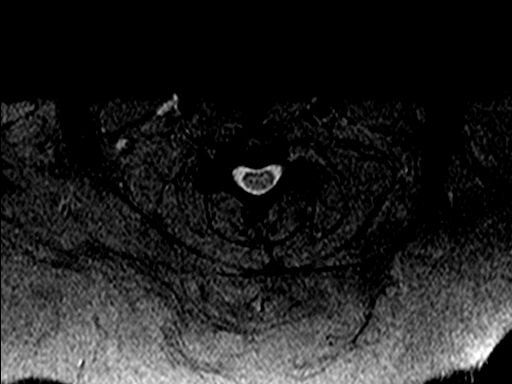
[im 16/18]
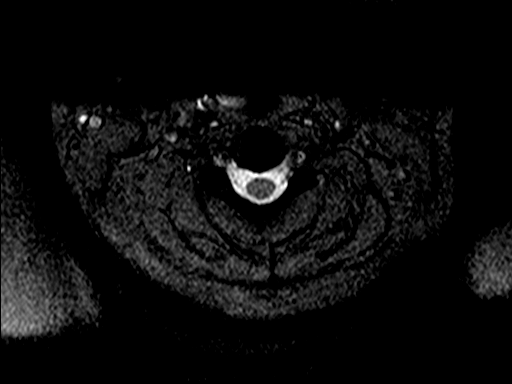

[Series 6: T1 fat-sat · axial · non-contrast · 3.0mm · 0.78mm/px · z∈[-41,+50]mm · 8 of 18 slices shown]
[im 1/18]
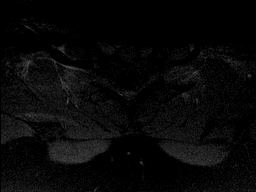
[im 2/18]
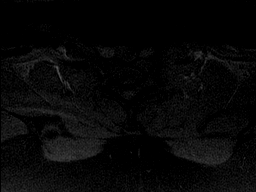
[im 6/18]
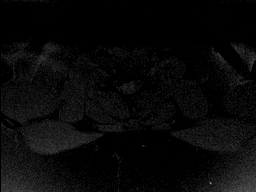
[im 8/18]
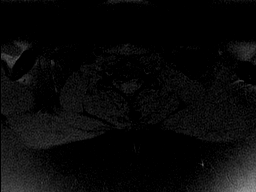
[im 10/18]
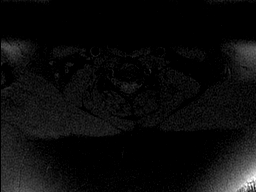
[im 12/18]
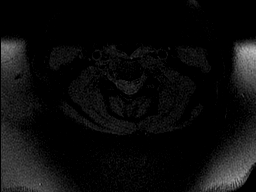
[im 16/18]
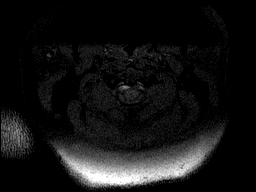
[im 18/18]
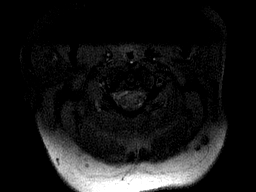

[Series 20: T1 post-contrast · sagittal · 3.0mm · 0.86mm/px · 6 of 11 slices shown]
[im 1/11]
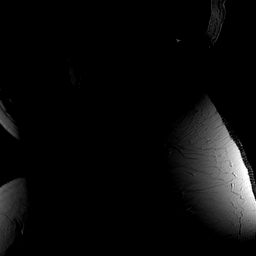
[im 3/11]
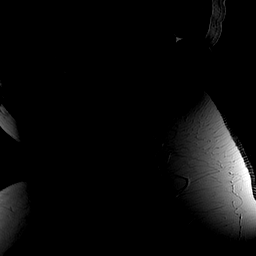
[im 5/11]
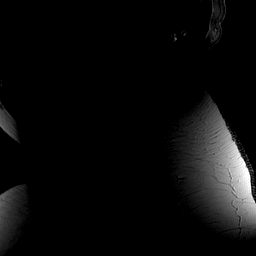
[im 7/11]
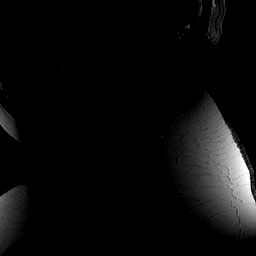
[im 9/11]
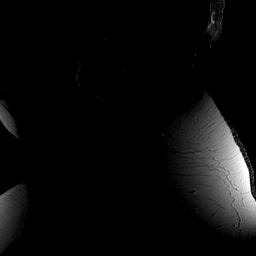
[im 11/11]
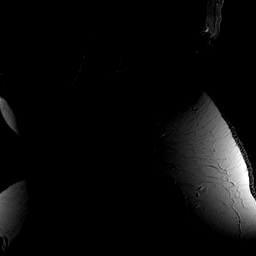

[Series 21: T1 fat-sat post-contrast · axial · 3.0mm · 0.78mm/px · z∈[-55,+43]mm · 9 of 17 slices shown]
[im 1/17]
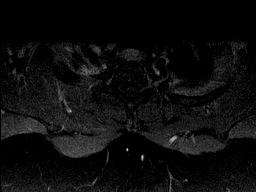
[im 3/17]
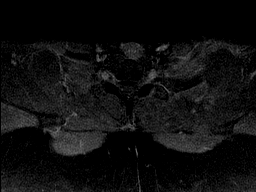
[im 5/17]
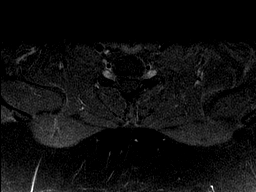
[im 7/17]
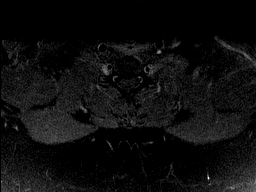
[im 9/17]
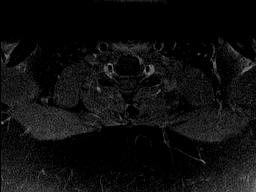
[im 11/17]
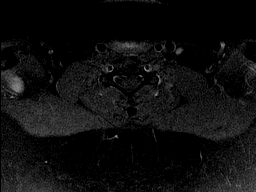
[im 13/17]
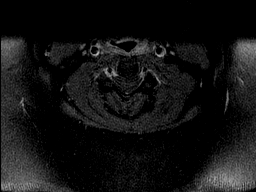
[im 15/17]
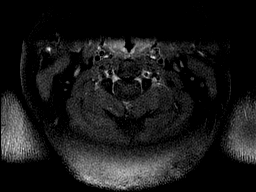
[im 17/17]
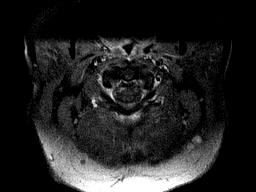

[43 of 48 positions shown; findings below may reference images not displayed]

DIAGNOSTIC STUDIES

EXAM

MR CERVICAL SPINE WITH CONTRAST

INDICATION

involuntary muscle contractions
involuntary muscle contractions, concern for MS, 15ml gadavist

TECHNIQUE

2D multi slice T1 weighted, T2 weighted, and STIR images are obtained in the sagittal projection.
T1 and T2 weighted axial images are obtained. After the injection of 15 cc of intravenous Gadavist,
T1 images were obtained in the sagittal and axial projections.

COMPARISONS

None

FINDINGS

The study is technically suboptimal secondary to large body habitus. No subluxation nor findings to
suggest acute bony injury/fracture are present. Minimal endplate changes compatible with de
generative disc disease are present at C3-4. The visualized marrow is otherwise unremarkable.

The cervical cord has a normal MR appearance. No abnormal signal nor cord enlargement is
identified. No abnormal cord enhancement is identified.

C2-3: No focal disc protrusion or central canal stenosis is identified. The neural foramina are
symmetric and widely patent.

C3-4: No focal disc protrusion or central canal stenosis is identified. Left neural foramina is not
well-visualized on the axial T2 weighted images but it is widely patent on the axial T1 weighted
images. Right neural foramina is patent.

C4-5: A large central to right paramidline disc protrusion is present. This is completely effacing
the anterior thecal sac and causing slight cord deformity. Minimal associated spurring is present.
Minimal narrowing of the right neural foramina is identified. The left neural foramina is patent.

C5-6: Mild uncovertebral spurring is present. No focal disc protrusion nor central canal stenosis
is identified. The neural foramina are symmetric and patent.

C6-7: No focal disc protrusion or central canal stenosis is identified. Neural foramina are
symmetric and patent.

C7-T1: No focal disc protrusion or central canal stenosis is identified. Neural foramina are
symmetric and patent.

IMPRESSION

1. Large d central  to right paramidline disc protrusion C4-5 with effacement of the thecal sac and
cord deformity.

2. Mild spondylitic changes as described.

3. No focal cord abnormality/enhancement.

Tech Notes:

involuntary muscle contractions, concern for MS, 15ml gadavist

## 2020-07-01 IMAGING — MR Head^Brain
8 series · 48 of 48 positions shown · non-contrast
Comparison: none

[Series 3: T1 · sagittal · 5.0mm · 0.45mm/px · 4 of 19 slices shown (1 of 2)]
[im 1/19]
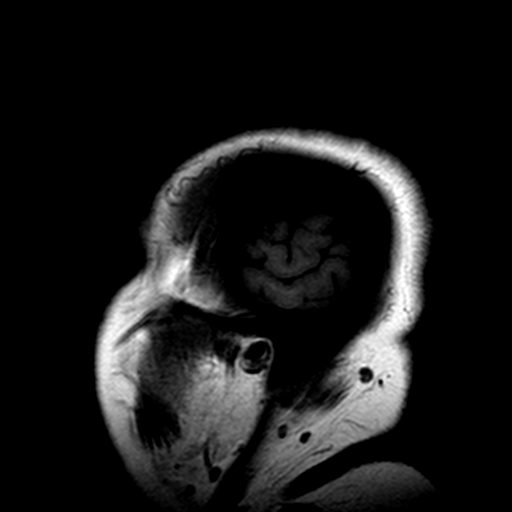
[im 7/19]
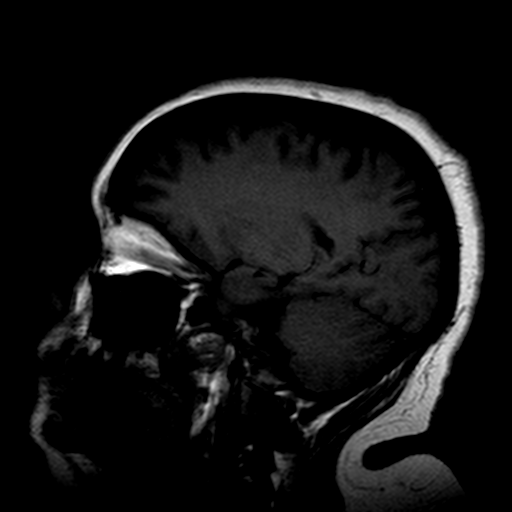
[im 13/19]
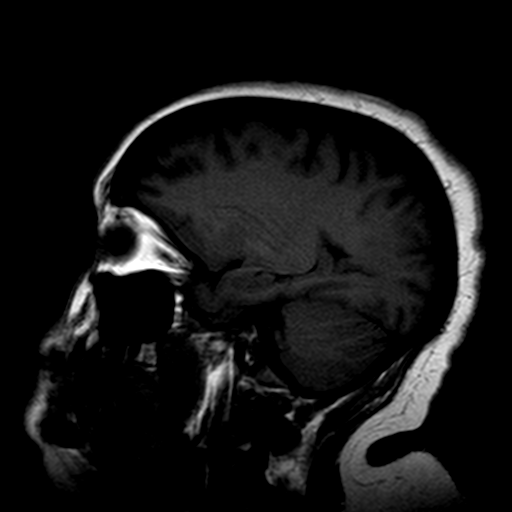
[im 19/19]
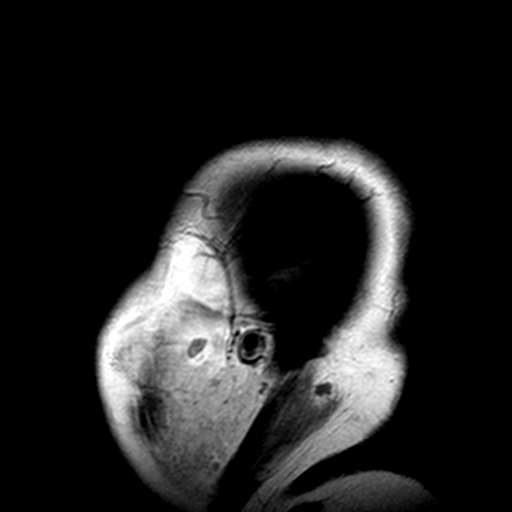

[Series 4: DWI · axial · 5.0mm · 1.80mm/px · z∈[-61,+66]mm · 13 of 56 slices shown (1 of 2)]
[im 1/56]
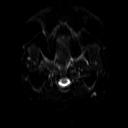
[im 5/56]
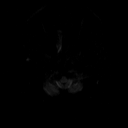
[im 10/56]
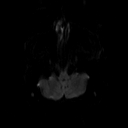
[im 14/56]
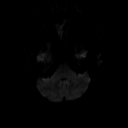
[im 19/56]
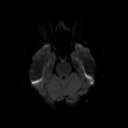
[im 23/56]
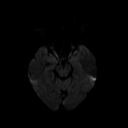
[im 28/56]
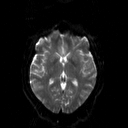
[im 33/56]
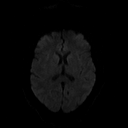
[im 37/56]
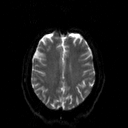
[im 42/56]
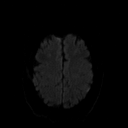
[im 46/56]
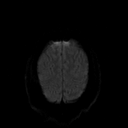
[im 51/56]
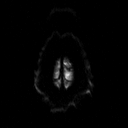
[im 56/56]
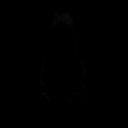

[Series 5: DWI · axial · 5.0mm · 1.80mm/px · z∈[-61,+66]mm · 5 of 20 slices shown (2 of 2)]
[im 1/20]
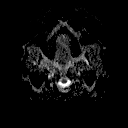
[im 5/20]
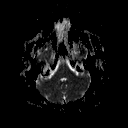
[im 10/20]
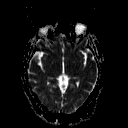
[im 15/20]
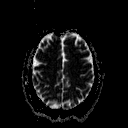
[im 20/20]
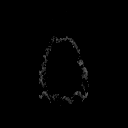

[Series 8: T1 · axial · 5.0mm · 0.45mm/px · z∈[-69,+76]mm · 5 of 23 slices shown (2 of 2)]
[im 1/23]
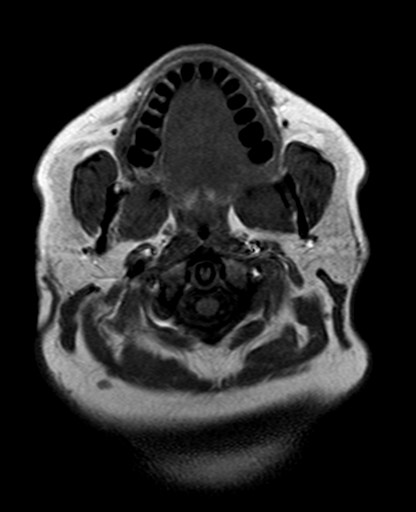
[im 6/23]
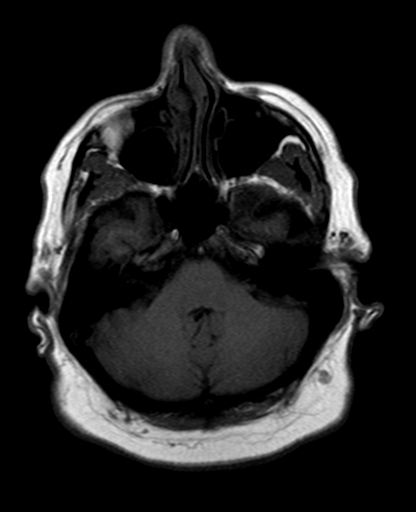
[im 12/23]
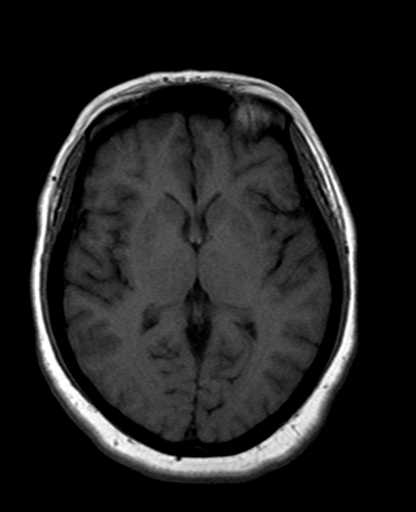
[im 17/23]
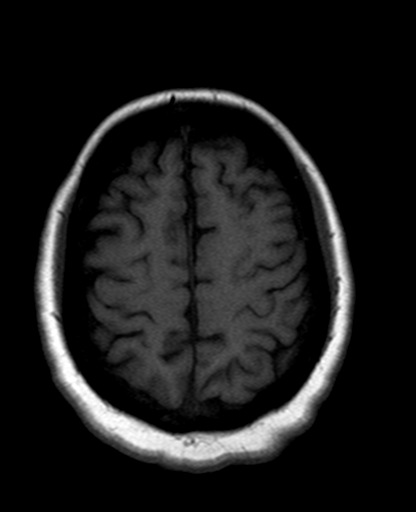
[im 23/23]
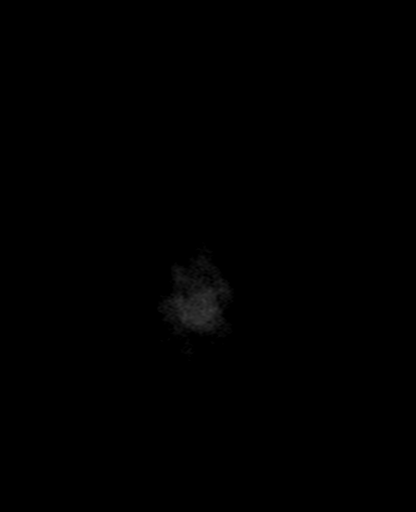

[Series 9: axial blood · axial · 5.0mm · 0.45mm/px · z∈[-69,+76]mm · 5 of 20 slices shown]
[im 1/20]
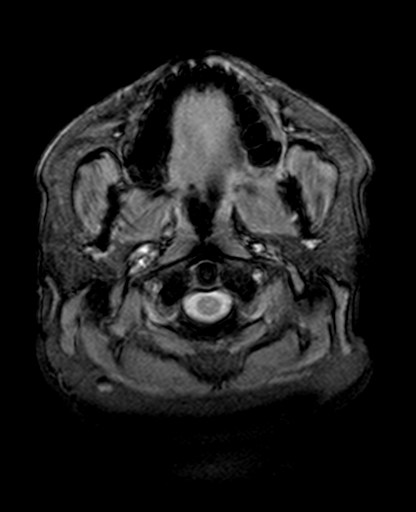
[im 5/20]
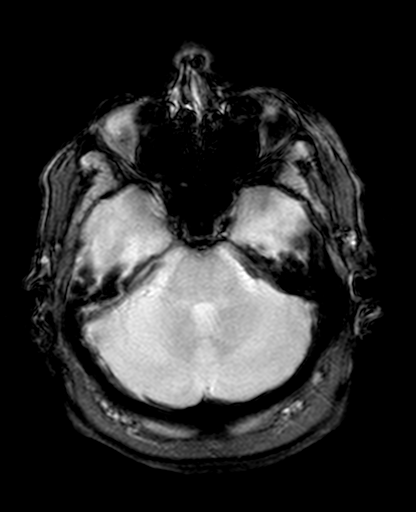
[im 10/20]
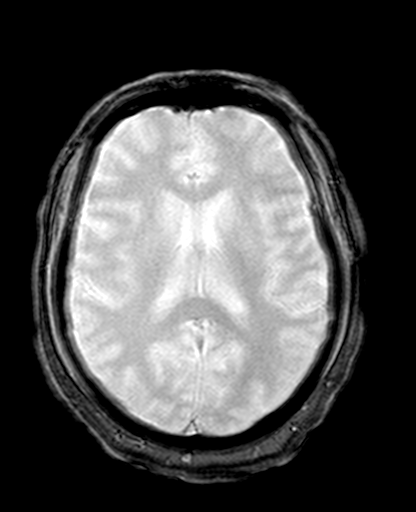
[im 15/20]
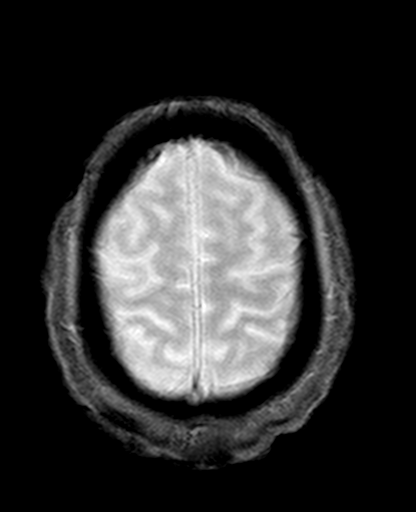
[im 20/20]
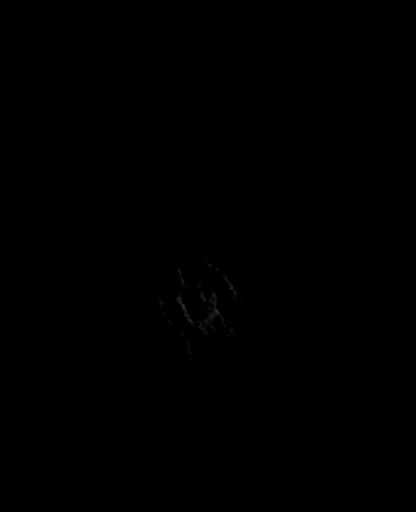

[Series 10: T2 · axial · 5.0mm · 0.72mm/px · z∈[-69,+76]mm · 5 of 22 slices shown]
[im 1/22]
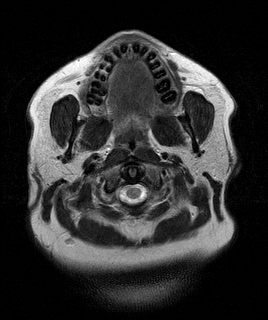
[im 6/22]
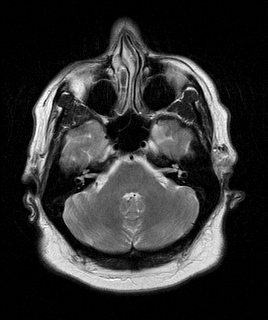
[im 11/22]
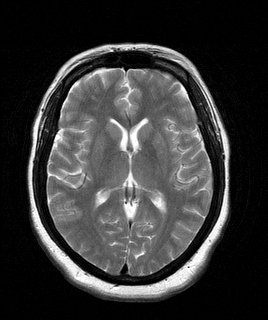
[im 16/22]
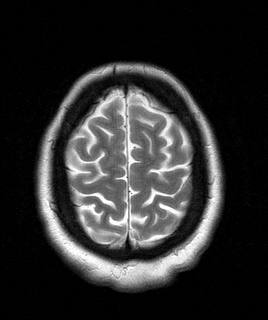
[im 22/22]
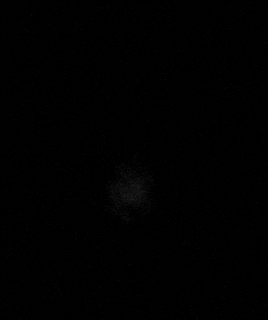

[Series 11: T1 fat-sat post-contrast · axial · 5.0mm · 0.90mm/px · z∈[-69,+76]mm · 5 of 23 slices shown (1 of 2)]
[im 1/23]
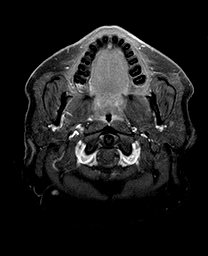
[im 6/23]
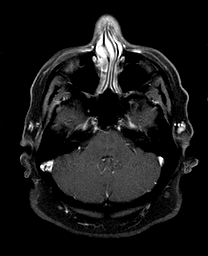
[im 12/23]
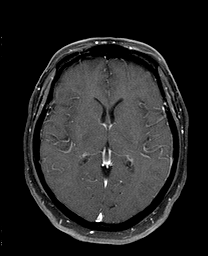
[im 17/23]
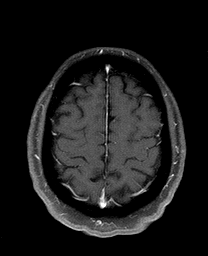
[im 23/23]
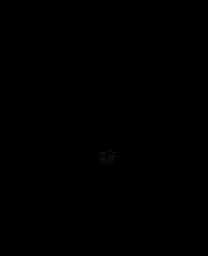

[Series 12: T1 fat-sat post-contrast · coronal · 5.0mm · 0.90mm/px · 6 of 25 slices shown (2 of 2)]
[im 1/25]
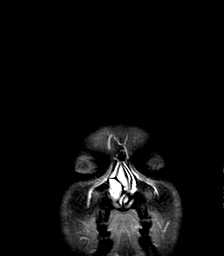
[im 5/25]
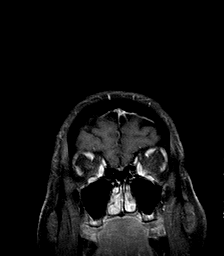
[im 10/25]
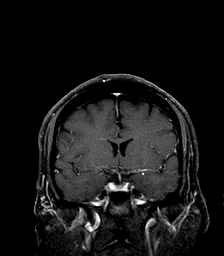
[im 15/25]
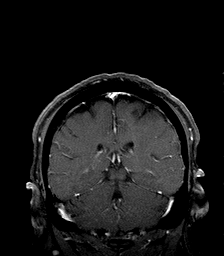
[im 20/25]
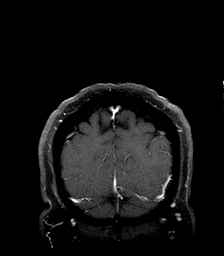
[im 25/25]
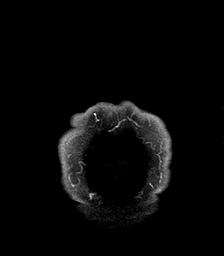

[48 of 48 positions shown; findings below may reference images not displayed]

DIAGNOSTIC STUDIES

EXAM

MR BRAIN WITH CONTRAST

INDICATION

involuntary muscle contractions,
involuntary muscle contractions, concern for MS, 15ml gadavist

TECHNIQUE

2D multislice T1 weighted SE images were obtained sagitally and axially. Axial diffusion/perfusion,
FLAIR, T2, and ?blood?  images were performed. T2 weighted sagittal images are obtained. After the
injection of 15 cc of Gadavist, T1 weighted FS images were obtained in the sagittal and axial
projections.

COMPARISON

NONE

FINDINGS

No limited diffusion to suggest acute ischemia/infarction is identified. No MR findings to suggest
intracranial hemorrhate are present; note that CT may be more sensitive depending on age and
location of blood. The sizing configuration of the ventricles, sulci, and cisterns are normal for
the age of the patient without dilatation, shift, or focal enlargement. No focal areas of abnormal
parenchymal signal are present. No abnormal enhancement nor mass is identified.

The globes and intraconal content have a normal MR appearance. The visualized portions of the
paranasal sinuses and mastoid air cells are unremarkable.

Expected intracranial vascular flow voids are identified.

IMPRESSION

Normal pre and postcontrast MRI of the brain for the age of the patient.

Tech Notes:

involuntary muscle contractions, concern for MS, 15ml gadavist

## 2020-07-27 ENCOUNTER — Encounter: Admit: 2020-07-27 | Discharge: 2020-07-27 | Payer: MEDICARE

## 2020-07-27 NOTE — Telephone Encounter
07/27/2020 12:18 PM  Patient called to ask if she was to get her FLP soon. Explained that about next week is perfect. Faxed her FLP order to [facility] in [place] but know that could be an issue. Mailed a copy to the patient. Asked her to fast for 12 hours. She is not sure she can do that. She has an insulin pump but her blood sugar drops dramatically at times requiring her to eat randomly. Asked if taking 1/2 her Metformin the night before would be possible and she thinks it might be. She will see what works but in case she cannot go without eating something (jelly and peanut butter are her "go tos" in the night, she will inform whomever is drawing her blood so that she has eaten this or that in the night to reflect the triglycerides etc.     Mailed her FLP order.

## 2020-08-01 ENCOUNTER — Encounter: Admit: 2020-08-01 | Discharge: 2020-08-01 | Payer: MEDICARE

## 2020-08-12 ENCOUNTER — Encounter: Admit: 2020-08-12 | Discharge: 2020-08-12 | Payer: MEDICARE

## 2020-08-12 DIAGNOSIS — I10 Essential (primary) hypertension: Secondary | ICD-10-CM

## 2020-08-12 DIAGNOSIS — Z8249 Family history of ischemic heart disease and other diseases of the circulatory system: Secondary | ICD-10-CM

## 2020-08-12 DIAGNOSIS — E1169 Type 2 diabetes mellitus with other specified complication: Secondary | ICD-10-CM

## 2020-08-12 DIAGNOSIS — E782 Mixed hyperlipidemia: Secondary | ICD-10-CM

## 2020-08-15 ENCOUNTER — Encounter: Admit: 2020-08-15 | Discharge: 2020-08-15 | Payer: MEDICARE

## 2020-08-15 NOTE — Telephone Encounter
Pt called wanting to know her results of lab tests from last week. Reviewed FLP and how to improve on lowering triglycerides and increasing HDL levels. PVU and states will try within next year to change lifestyle.

## 2020-09-05 IMAGING — CR [ID]
2 series · 2 of 2 positions shown · non-contrast
Comparison: none

[chest pa]
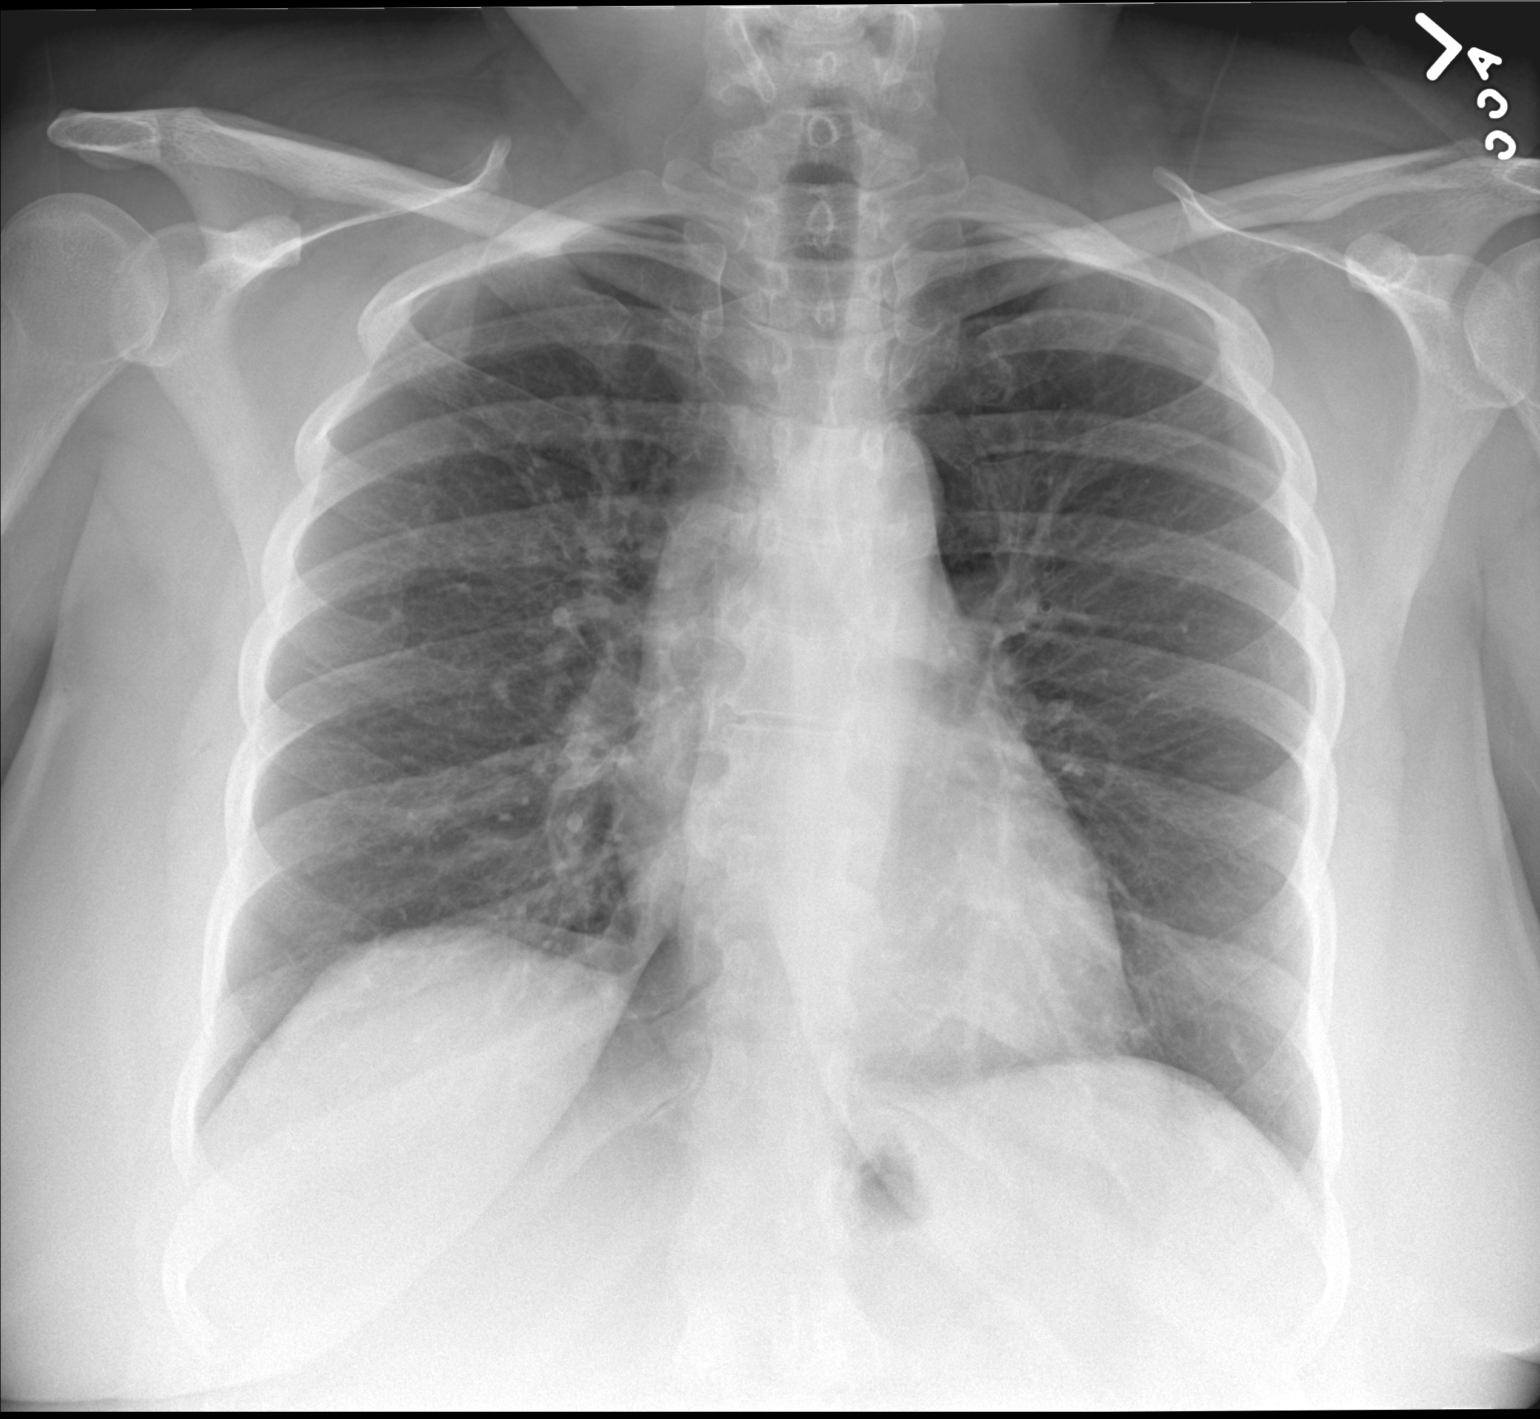

[chest lat]
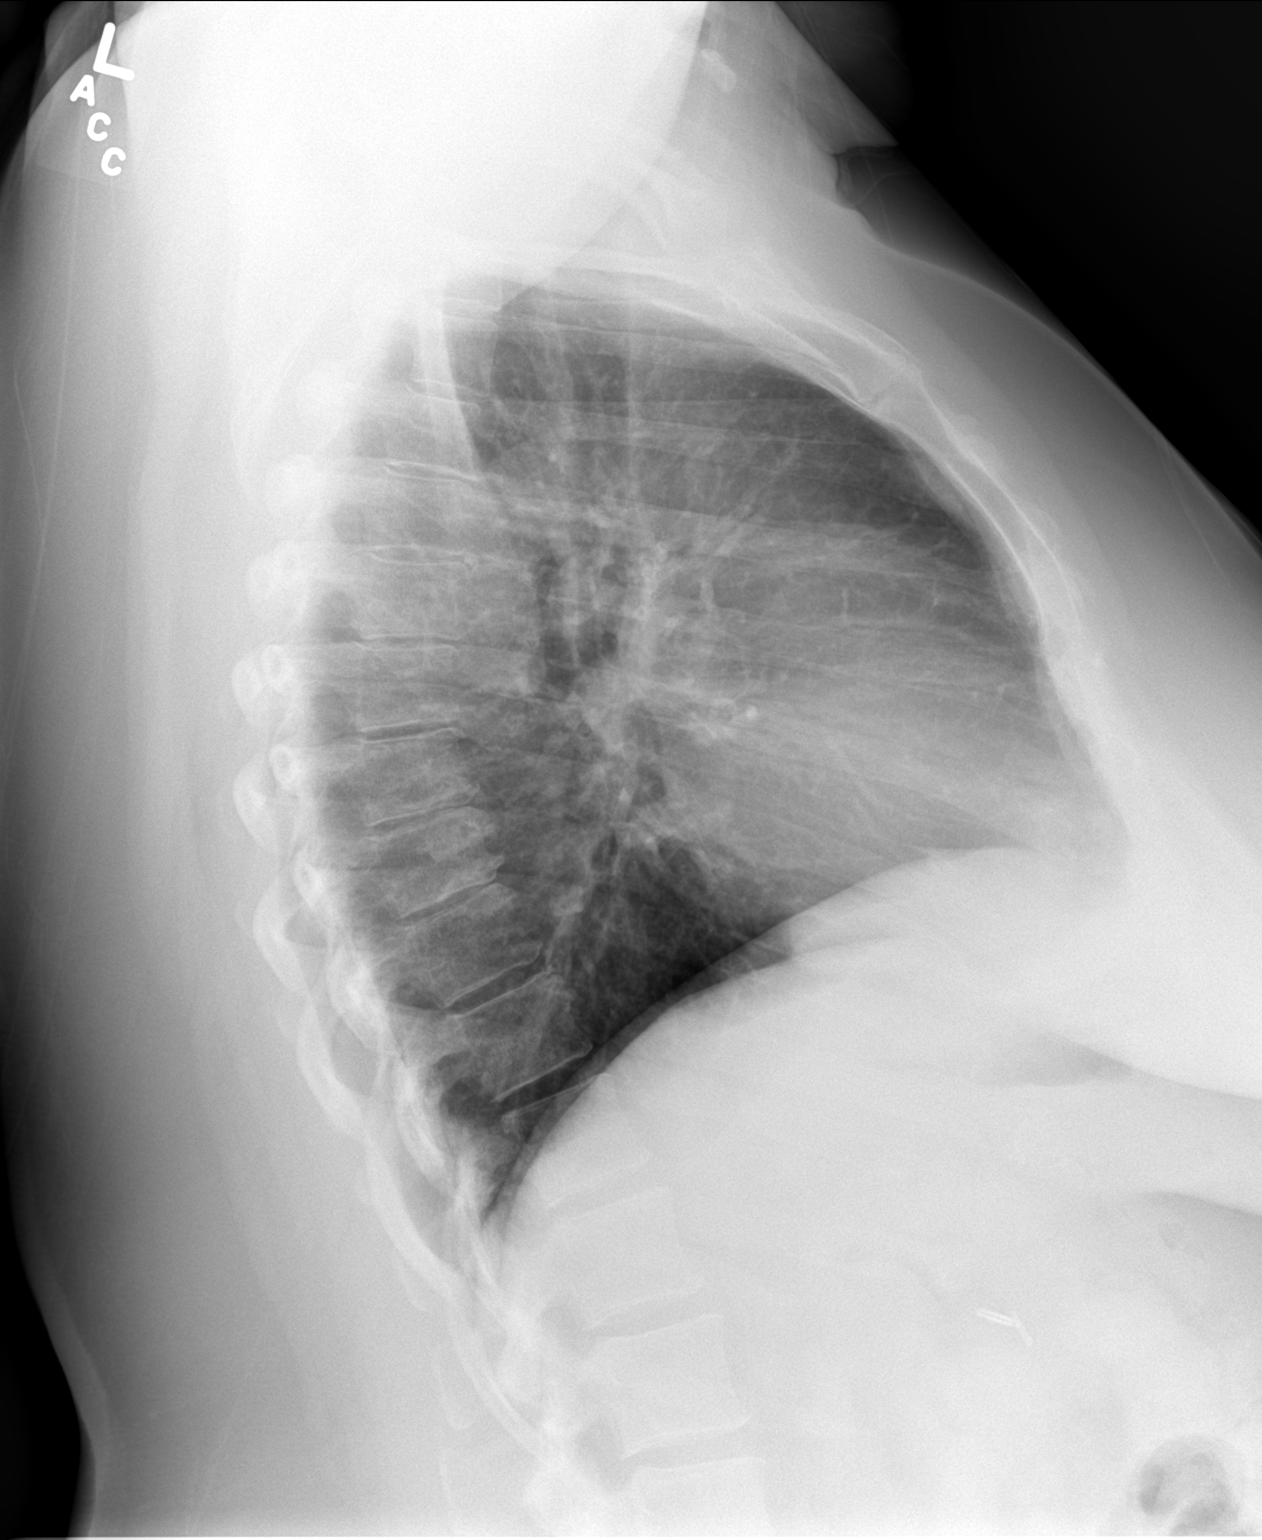

[2 of 2 positions shown; findings below may reference images not displayed]

DIAGNOSTIC STUDIES

EXAM

XR chest 2V

INDICATION

Cough x 3 weeks
Cough x 3 weeks.

TECHNIQUE

PA and lateral views of the chest.

COMPARISONS

None available

FINDINGS

Cardiomediastinal silhouette is within normal limits. Lungs are clear. Minor degenerative changes
of the thoracic spine are noted.

IMPRESSION

No evidence for active disease in the chest.

Tech Notes:

Cough x 3 weeks.

## 2020-10-31 IMAGING — CR [ID]
3 series · 3 of 3 positions shown · non-contrast
Comparison: none

[foot ap]
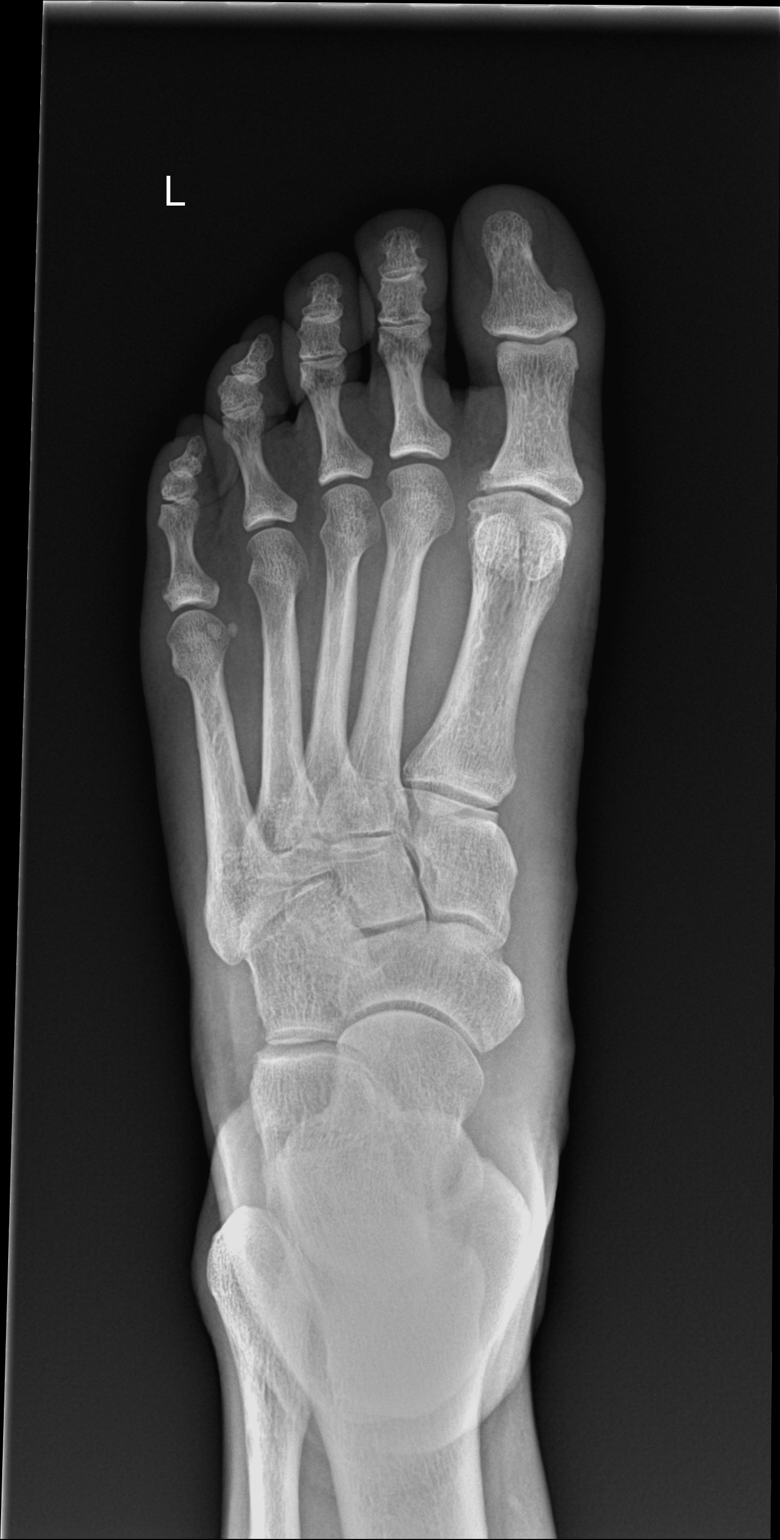

[foot obl]
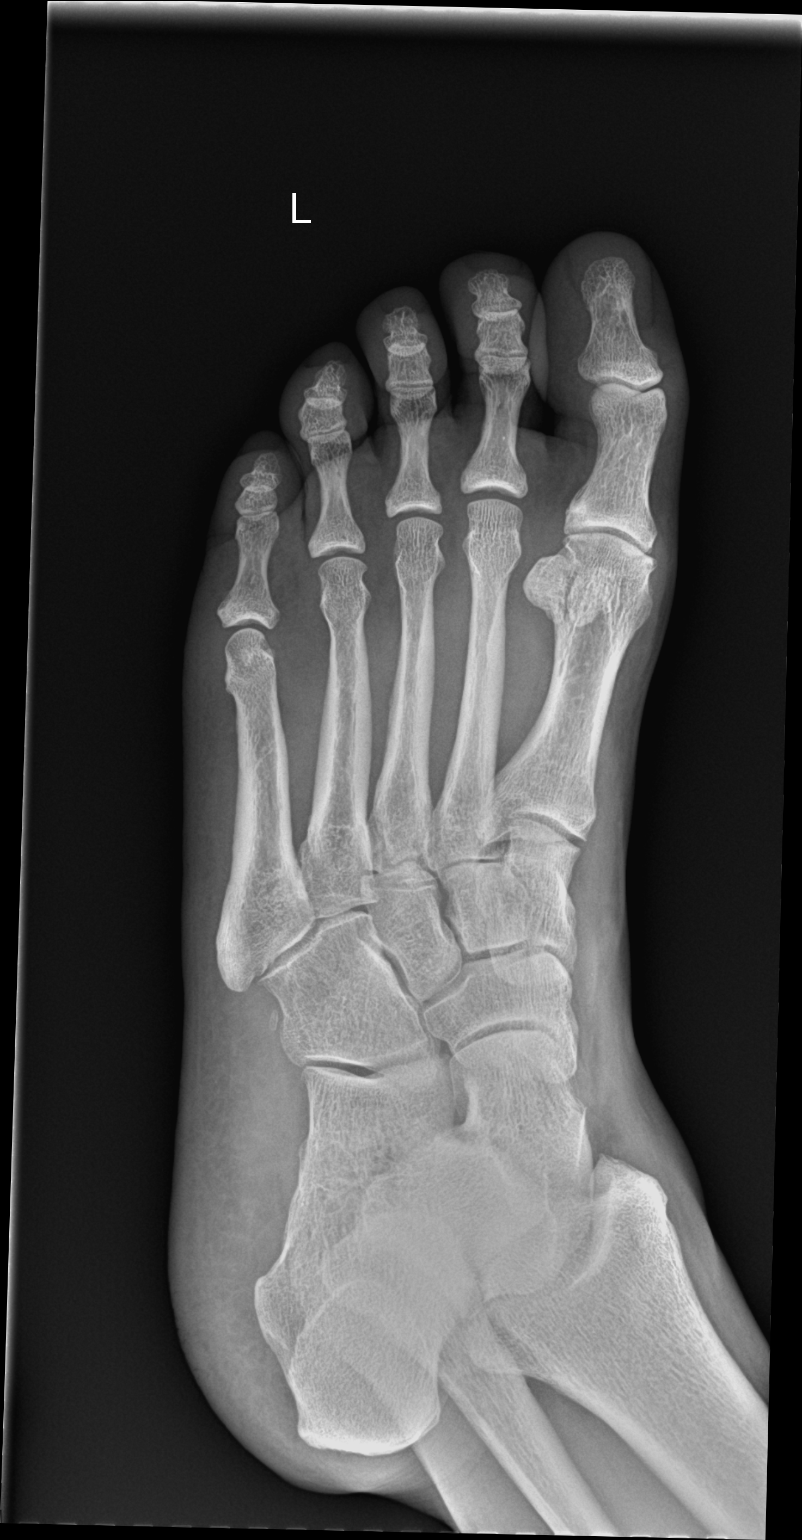

[foot lat]
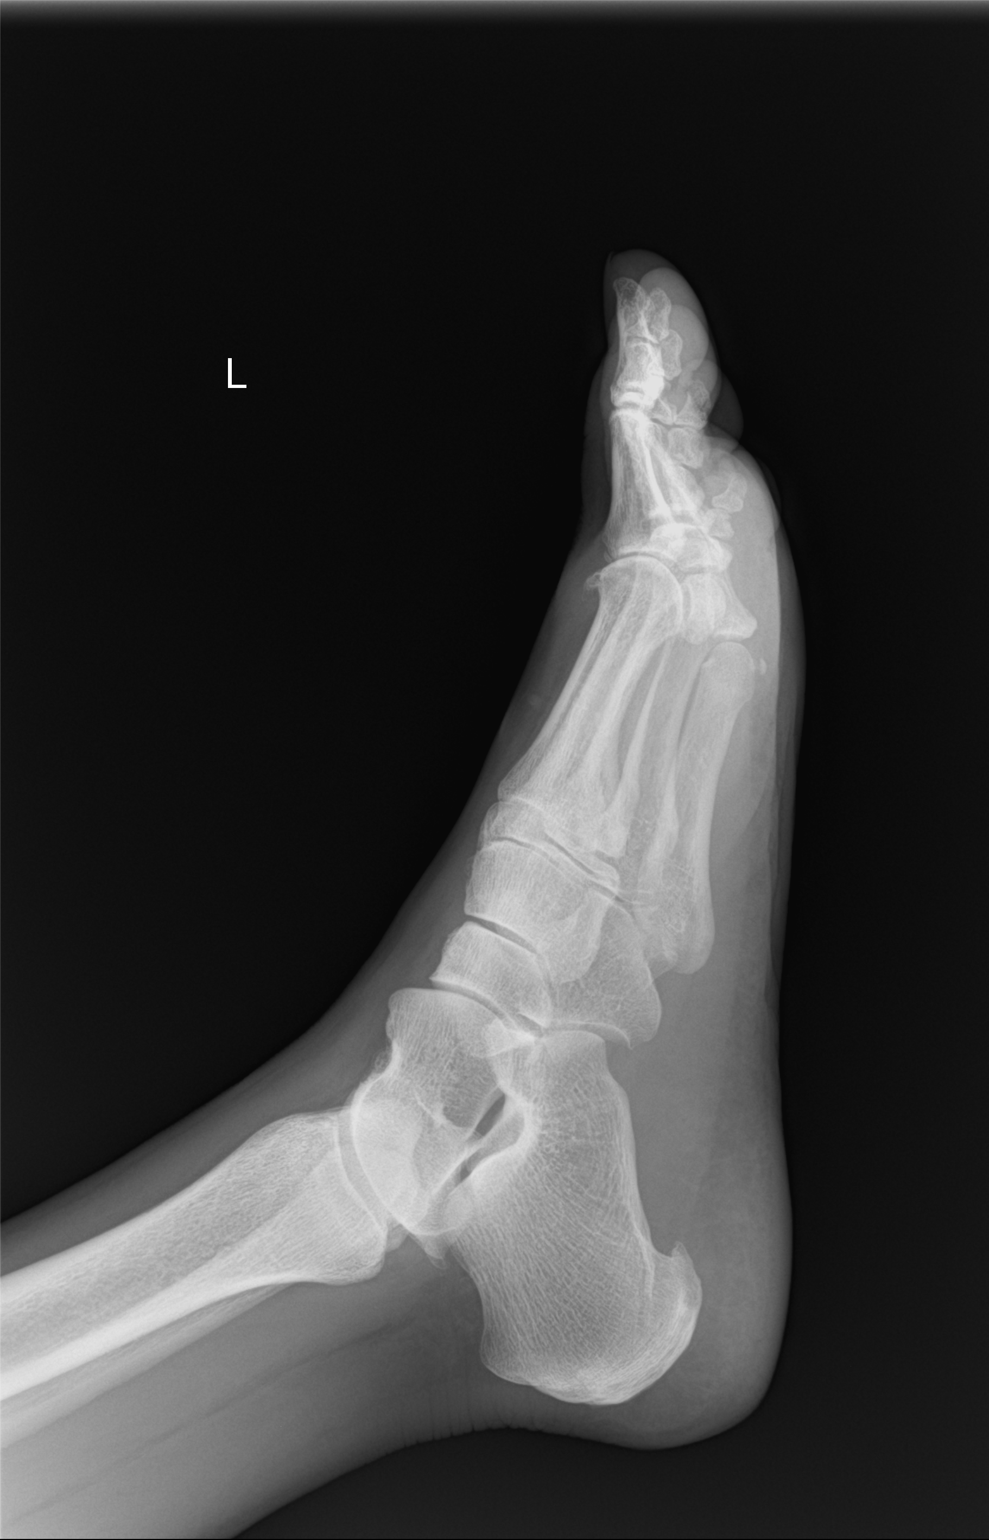

[3 of 3 positions shown; findings below may reference images not displayed]

EXAM

XR foot LT min 3V

INDICATION

foot pain
C/O DORSAL FOOT PAIN, WORSE WHEN WEARING SHOES. NO KNOWN INJURY.   HB

TECHNIQUE

Three views of the left foot

COMPARISONS

None available at the time of dictation.

FINDINGS

No radiographic evidence of an acute fracture, osseous malalignment, or aggressive focal osseous
lesion. There is anatomic joint alignment. Minimal degenerative change of the left 1st
metatarsophalangeal joint. Small plantar calcaneal spur.

IMPRESSION
1. No radiographic evidence of an acute osseous abnormality.

Tech Notes:

C/O DORSAL FOOT PAIN, WORSE WHEN WEARING SHOES. NO KNOWN INJURY.
HB

## 2020-11-18 ENCOUNTER — Encounter: Admit: 2020-11-18 | Discharge: 2020-11-18 | Payer: MEDICARE

## 2020-11-18 DIAGNOSIS — E669 Obesity, unspecified: Secondary | ICD-10-CM

## 2020-11-18 DIAGNOSIS — Z8249 Family history of ischemic heart disease and other diseases of the circulatory system: Secondary | ICD-10-CM

## 2020-11-18 DIAGNOSIS — I10 Essential (primary) hypertension: Secondary | ICD-10-CM

## 2020-11-18 DIAGNOSIS — E782 Mixed hyperlipidemia: Secondary | ICD-10-CM

## 2020-11-18 DIAGNOSIS — R0600 Dyspnea, unspecified: Secondary | ICD-10-CM

## 2020-11-18 DIAGNOSIS — R072 Precordial pain: Secondary | ICD-10-CM

## 2020-11-18 NOTE — Progress Notes
Patient c/o dyspnea especially at night.

## 2020-12-05 ENCOUNTER — Ambulatory Visit: Admit: 2020-12-05 | Discharge: 2020-12-05 | Payer: MEDICARE

## 2020-12-05 ENCOUNTER — Encounter: Admit: 2020-12-05 | Discharge: 2020-12-05 | Payer: MEDICARE

## 2020-12-05 DIAGNOSIS — R0600 Dyspnea, unspecified: Secondary | ICD-10-CM

## 2020-12-05 DIAGNOSIS — I10 Essential (primary) hypertension: Secondary | ICD-10-CM

## 2020-12-05 DIAGNOSIS — E785 Hyperlipidemia, unspecified: Secondary | ICD-10-CM

## 2020-12-05 IMAGING — US ECHOCOMPL
1 series · 12 of 24 positions shown · non-contrast
Comparison: none

[Series 1: us echo 2d, complete · 81 acquisitions, 12 frames shown]
[im 4/81]
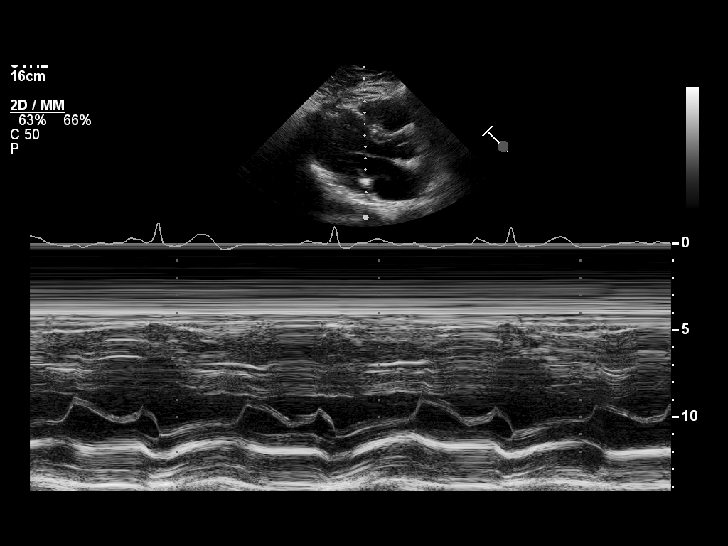
[im 14/81]
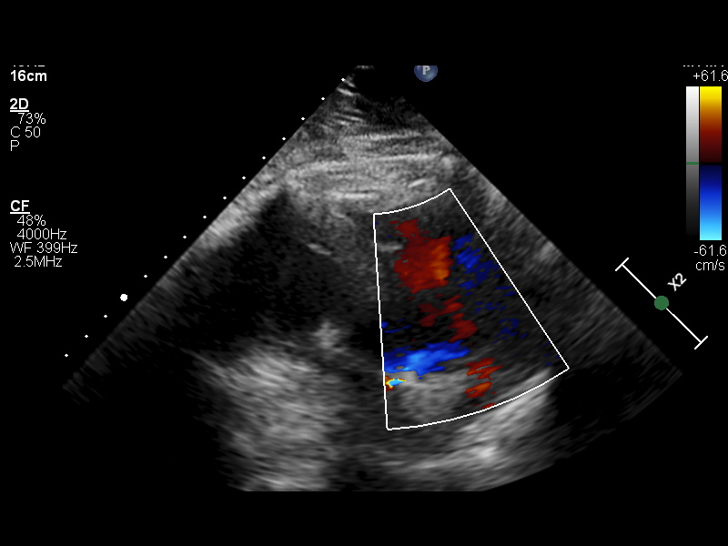
[im 18/81]
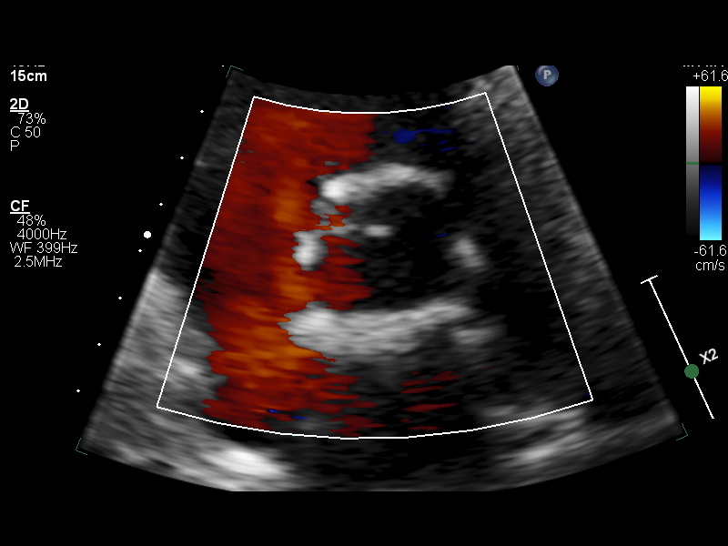
[im 25/81]
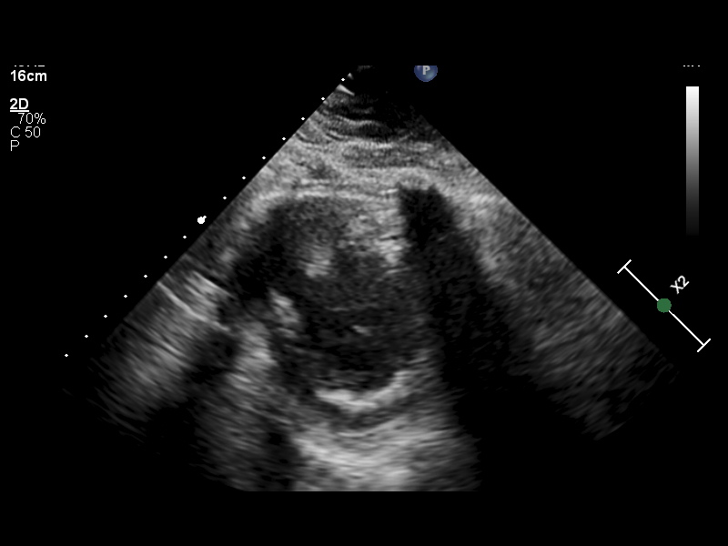
[im 32/81]
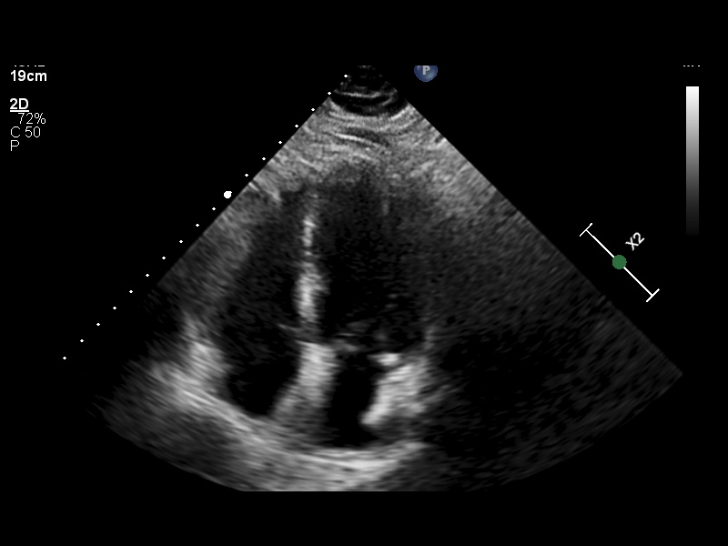
[im 35/81]
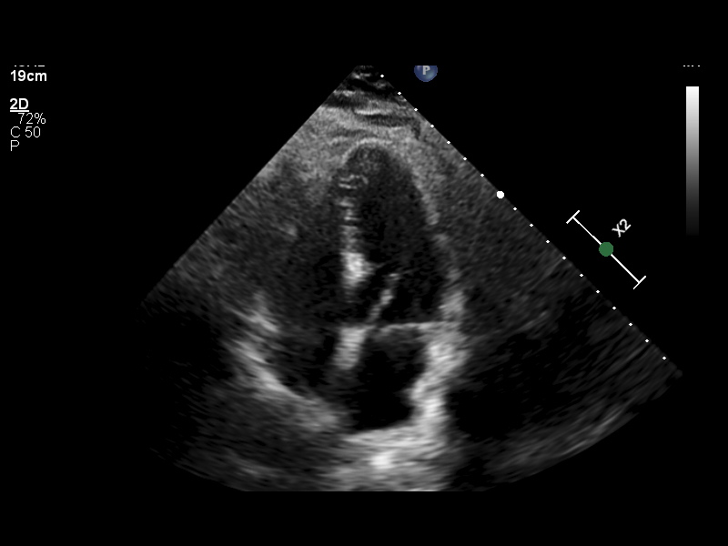
[im 42/81]
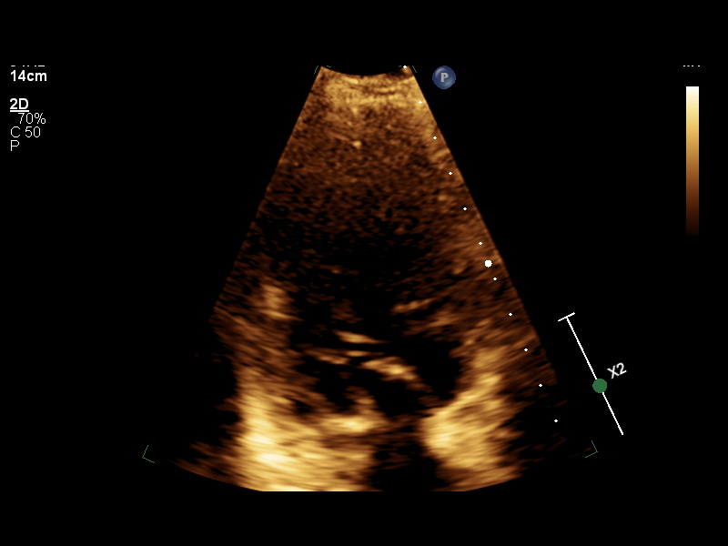
[im 53/81]
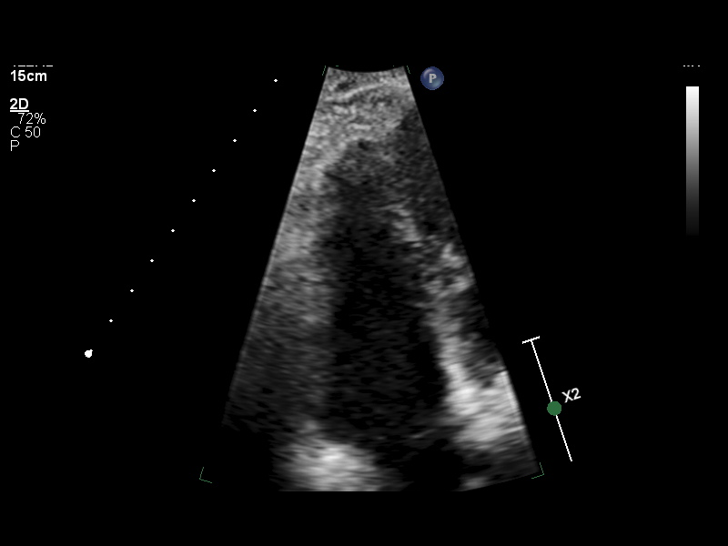
[im 56/81]
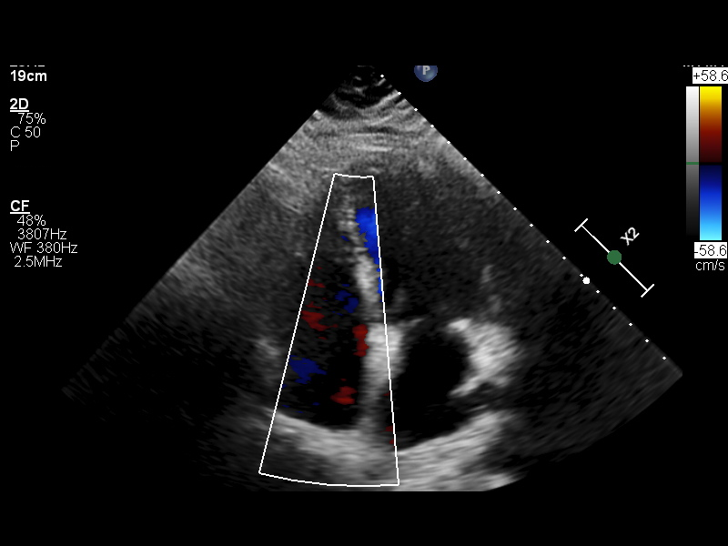
[im 63/81]
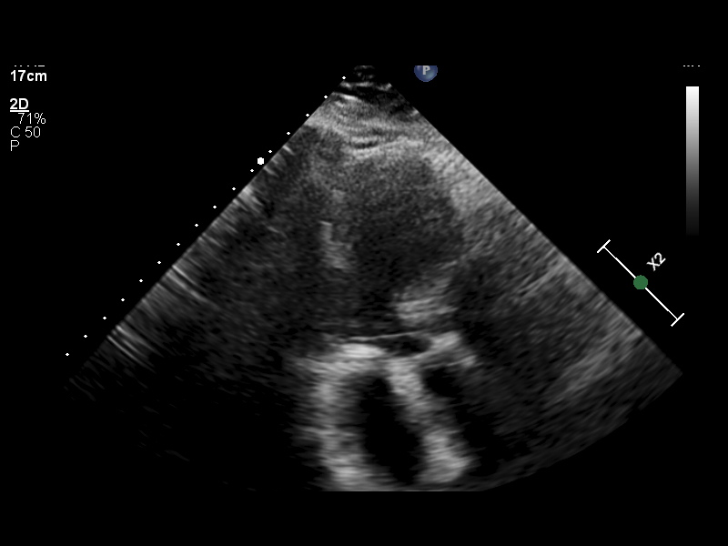
[im 74/81]
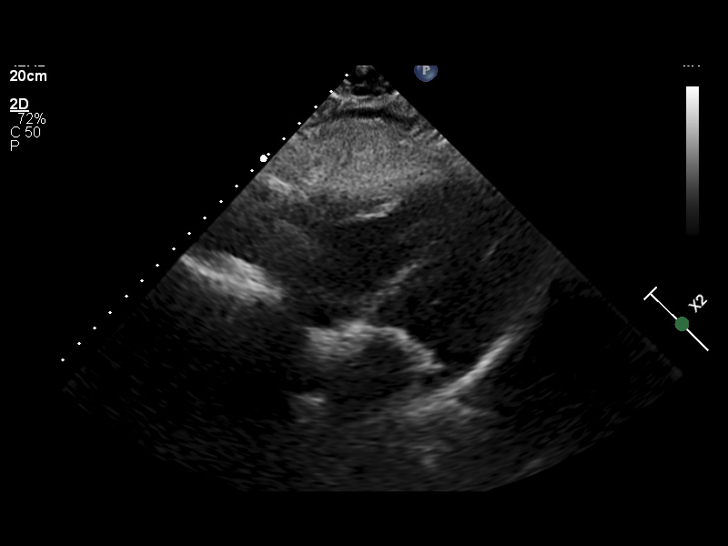
[im 81/81]
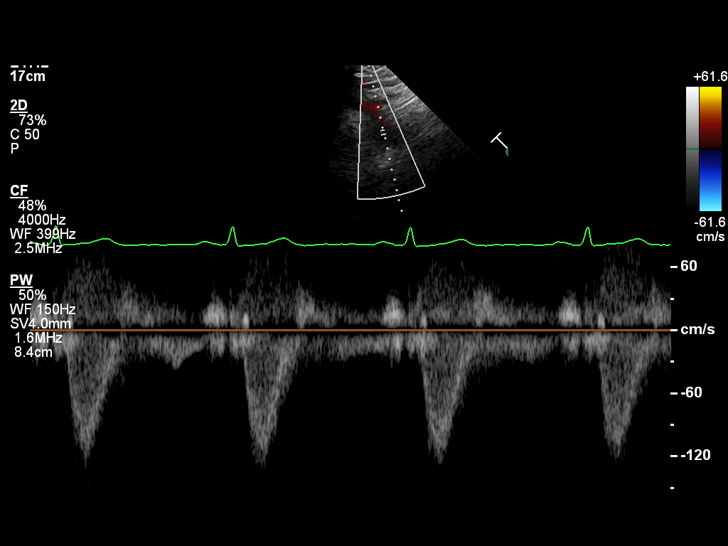

[12 of 24 positions shown; findings below may reference images not displayed]

12/05/20 -  2D + DOPPLER ECHO
Location Performed: [HOSPITAL]

Referring Provider:
Fellow:
Location of Interp:
Sonographer: External Staff

Indications:           Hypertension, dyspnea, hyperlipidemia, CAD

Vitals
Height   Weight   BSA (Calculated)   BP   Comments
149.9 cm (4' 11")   105.7 kg (233 lb)   2.1   130/85

Interpretation Summary
The left ventricular size is normal. Concentric remodeling. The left ventricular systolic function
is normal. The visually estimated ejection fraction is 65%. There are no segmental wall motion
abnormalities.
The right ventricular size is normal. The right ventricular systolic function is normal.
Normal biatrial size.
Aortic Valve: Cannot exclude bicuspid morphology. No stenosis. No regurgitation.
The pulmonary artery pressure could not be estimated due to inadequate tricuspid regurgitation
signal.
No pericardial effusion.
Compared with study dated 07/23/2017, no significant change is noted.

Echocardiographic Findings
Left Ventricle   The left ventricular size is normal. Concentric remodeling. The left ventricular
systolic function is normal. The visually estimated ejection fraction is 65%. There are no segmental
wall motion abnormalities. Cannot determine left ventricular diastolic function.
Right Ventricle   The right ventricular size is normal. The right ventricular systolic function is
normal. The pulmonary artery pressure could not be estimated due to inadequate tricuspid
regurgitation signal.
Left Atrium   Normal size.
Right Atrium   Normal size.
IVC/SVC   Normal central venous pressure (0-5 mm Hg).
Mitral Valve   Normal valve structure. No stenosis. Trace regurgitation. There is mild mitral
annular calcification.
Tricuspid Valve   Normal valve structure. No stenosis. No regurgitation.
Aortic Valve   Cannot exclude bicuspid morphology. No stenosis. No regurgitation.
Pulmonary   The pulmonic valve was not seen well but no Doppler evidence of stenosis. No
regurgitation.
Aorta   The aortic root and ascending aorta are normal in size.
Pericardium   No pericardial effusion.

Left Heart 2D Measurements (Normal Ranges)
EF (Visual)
65 %
LVIDD
4.1 cm  (Range: 3.8 - 5.2)
LVIDS
1.7 cm  (Range: 2.2 - 3.5)
IVS
1.1 cm  (Range: 0.6 - 0.9)
LV PW
1.2 cm  (Range: 0.6 - 0.9)
LA Size
3.3 cm  (Range: 2.7 - 3.8)

Right Heart 2D   M-Mode Measurements (Normal Ranges) (Range)
RV Basal Dia
3.6 cm  (2.5 - 4.1)
RV Mid Dia
2.3 cm  (1.9 - 3.5)
HORACIO RODRIGO
14.8 cm2  (<18)
M-Mode TAPSE
2.6 cm  (>1.7)

Left Heart 2D Addnl Measurements (Normal Ranges)
LV Systolic Vol
65 mL  (Range: 14 - 42)
LV Systolic Vol Index
31 mL/m2  (Range: 8 - 24)
LV Diastolic Vol
18 mL  (Range: 46 - 106)
LV Diastolic Vol Index
9 mL/m2  (Range: 29 - 61)
LA Vol
32 mL  (Range: 22 - 52)
LA Vol Index
15.24 mL/m2  (Range: 16 - 34)
LV Mass
161 g  (Range: 67 - 162)
LV Mass Index
77 g/m2  (Range: 43 - 95)
RWT
0.59  (Range: <=0.42)

Aortic Root Measurements (Normal Ranges)
Sinus
2.9 cm  (Range: 2.4 - 3.6)
ADESANMI OSMAN
2.9 cm

Doppler (Spectral and Color Flow)
Aortic valve peak velocity
1.9 m/s

Tech Notes:

## 2020-12-06 ENCOUNTER — Encounter: Admit: 2020-12-06 | Discharge: 2020-12-06 | Payer: MEDICARE

## 2020-12-06 NOTE — Telephone Encounter
12/06/2020 5:07 PM  Patient called late to ask about her results. It looked fairly normal and in 2019 the valves were normal, not bicuspid. Told her the nurses would speak with Dr. Sandria Manly and find out the absolute results for her tomorrow. She verbalizes understanding.

## 2020-12-12 ENCOUNTER — Encounter: Admit: 2020-12-12 | Discharge: 2020-12-12 | Payer: MEDICARE

## 2020-12-12 NOTE — Telephone Encounter
-----   Message from Altamease Oiler, MD sent at 12/12/2020 11:01 AM CST -----  Let her know that her echocardiogram looked good.  Normal heart pump function.  No changes from prior.  No valve issues.  Thank you

## 2020-12-12 NOTE — Telephone Encounter
VM message left with results for patient. Call back number provided for questions or concerns.
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
## 2021-01-25 ENCOUNTER — Encounter: Admit: 2021-01-25 | Discharge: 2021-01-25 | Payer: MEDICARE

## 2021-04-19 ENCOUNTER — Encounter: Admit: 2021-04-19 | Discharge: 2021-04-19 | Payer: MEDICARE

## 2021-04-19 MED ORDER — ROSUVASTATIN 20 MG PO TAB
ORAL_TABLET | ORAL | 1 refills | 90.00000 days | Status: AC
Start: 2021-04-19 — End: ?

## 2021-05-24 ENCOUNTER — Encounter: Admit: 2021-05-24 | Discharge: 2021-05-24 | Payer: MEDICARE
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
## 2021-06-06 ENCOUNTER — Encounter: Admit: 2021-06-06 | Discharge: 2021-06-06 | Payer: MEDICARE

## 2021-06-06 DIAGNOSIS — Z0389 Encounter for observation for other suspected diseases and conditions ruled out: Secondary | ICD-10-CM

## 2021-06-06 DIAGNOSIS — I10 Essential (primary) hypertension: Secondary | ICD-10-CM

## 2021-06-06 DIAGNOSIS — E782 Mixed hyperlipidemia: Secondary | ICD-10-CM

## 2021-06-06 NOTE — Patient Instructions
Lipids in 6 months   Follow up in 1 year    Follow up as directed.  Call sooner if issues.  Call the [facility] nursing line at [phone].  Leave a detailed message for the nurse in [address] [facility] with how we can assist you and we will call you back.
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
## 2021-10-19 ENCOUNTER — Encounter: Admit: 2021-10-19 | Discharge: 2021-10-19 | Payer: MEDICARE

## 2021-10-19 MED ORDER — ROSUVASTATIN 20 MG PO TAB
ORAL_TABLET | ORAL | 3 refills | 90.00000 days | Status: AC
Start: 2021-10-19 — End: ?

## 2022-02-23 ENCOUNTER — Encounter: Admit: 2022-02-23 | Discharge: 2022-02-23 | Payer: MEDICARE

## 2022-04-16 ENCOUNTER — Encounter: Admit: 2022-04-16 | Discharge: 2022-04-16 | Payer: MEDICARE

## 2022-04-16 NOTE — Telephone Encounter
Patient called nursing line stating that she was unable to get her annual appointment scheduled with Dr. Erling Cruz. Offered patient 4/8 appointment in [place] at 3:20. Patient confirmed D-T-L of appointment.

## 2022-04-17 ENCOUNTER — Encounter: Admit: 2022-04-17 | Discharge: 2022-04-17 | Payer: MEDICARE

## 2022-04-18 ENCOUNTER — Encounter: Admit: 2022-04-18 | Discharge: 2022-04-18 | Payer: MEDICARE

## 2022-04-18 DIAGNOSIS — E782 Mixed hyperlipidemia: Secondary | ICD-10-CM

## 2022-04-18 DIAGNOSIS — Z0389 Encounter for observation for other suspected diseases and conditions ruled out: Secondary | ICD-10-CM

## 2022-04-18 DIAGNOSIS — I10 Essential (primary) hypertension: Secondary | ICD-10-CM

## 2022-04-23 ENCOUNTER — Encounter: Admit: 2022-04-23 | Discharge: 2022-04-23 | Payer: MEDICARE

## 2022-04-23 DIAGNOSIS — E785 Hyperlipidemia, unspecified: Secondary | ICD-10-CM

## 2022-04-23 DIAGNOSIS — I10 Essential (primary) hypertension: Secondary | ICD-10-CM

## 2022-04-23 DIAGNOSIS — E119 Type 2 diabetes mellitus without complications: Secondary | ICD-10-CM

## 2022-04-23 DIAGNOSIS — Z136 Encounter for screening for cardiovascular disorders: Secondary | ICD-10-CM

## 2022-04-23 DIAGNOSIS — E782 Mixed hyperlipidemia: Secondary | ICD-10-CM

## 2022-04-23 DIAGNOSIS — F32A Depression: Secondary | ICD-10-CM

## 2022-04-23 DIAGNOSIS — E0590 Thyrotoxicosis, unspecified without thyrotoxic crisis or storm: Secondary | ICD-10-CM

## 2022-04-23 DIAGNOSIS — Z0389 Encounter for observation for other suspected diseases and conditions ruled out: Secondary | ICD-10-CM

## 2022-07-27 ENCOUNTER — Encounter: Admit: 2022-07-27 | Discharge: 2022-07-27 | Payer: MEDICARE

## 2022-09-11 ENCOUNTER — Encounter: Admit: 2022-09-11 | Discharge: 2022-09-11 | Payer: MEDICARE

## 2022-10-25 ENCOUNTER — Encounter: Admit: 2022-10-25 | Discharge: 2022-10-25 | Payer: MEDICARE

## 2022-10-25 MED ORDER — ROSUVASTATIN 20 MG PO TAB
ORAL_TABLET | ORAL | 3 refills | 90.00000 days | Status: AC
Start: 2022-10-25 — End: ?

## 2023-04-06 ENCOUNTER — Encounter: Admit: 2023-04-06 | Discharge: 2023-04-06 | Payer: MEDICARE

## 2023-05-31 ENCOUNTER — Encounter: Admit: 2023-05-31 | Discharge: 2023-05-31 | Payer: MEDICARE

## 2023-06-04 ENCOUNTER — Encounter: Admit: 2023-06-04 | Discharge: 2023-06-04 | Payer: MEDICARE

## 2023-06-11 LAB — LIPID PROFILE
CHOLESTEROL/HDL %: 3
CHOLESTEROL: 101
HDL: 36 — ABNORMAL LOW (ref >=40)
LDL: 49
TRIGLYCERIDES: 84
VLDL: 17

## 2023-06-12 ENCOUNTER — Encounter: Admit: 2023-06-12 | Discharge: 2023-06-12 | Payer: MEDICARE

## 2023-06-12 DIAGNOSIS — Z0389 Encounter for observation for other suspected diseases and conditions ruled out: Secondary | ICD-10-CM

## 2023-06-12 DIAGNOSIS — Z136 Encounter for screening for cardiovascular disorders: Secondary | ICD-10-CM

## 2023-06-12 DIAGNOSIS — I10 Essential (primary) hypertension: Secondary | ICD-10-CM

## 2023-06-12 DIAGNOSIS — E782 Mixed hyperlipidemia: Secondary | ICD-10-CM

## 2023-11-03 ENCOUNTER — Encounter: Admit: 2023-11-03 | Discharge: 2023-11-03 | Payer: MEDICARE

## 2023-11-04 ENCOUNTER — Encounter: Admit: 2023-11-04 | Discharge: 2023-11-04 | Payer: MEDICARE
